# Patient Record
Sex: Female | Born: 1950 | Hispanic: Yes | Smoking: Never smoker
Health system: Southern US, Community
[De-identification: ages and names within clinical notes are randomized; demographics above are authoritative.]

## PROBLEM LIST (undated history)

## (undated) DIAGNOSIS — I10 Essential (primary) hypertension: Secondary | ICD-10-CM

## (undated) DIAGNOSIS — E119 Type 2 diabetes mellitus without complications: Secondary | ICD-10-CM

## (undated) DIAGNOSIS — M1990 Unspecified osteoarthritis, unspecified site: Secondary | ICD-10-CM

## (undated) DIAGNOSIS — E039 Hypothyroidism, unspecified: Secondary | ICD-10-CM

## (undated) HISTORY — DX: Unspecified osteoarthritis, unspecified site: M19.90

## (undated) HISTORY — DX: Essential (primary) hypertension: I10

## (undated) HISTORY — DX: Hypothyroidism, unspecified: E03.9

## (undated) HISTORY — DX: Type 2 diabetes mellitus without complications: E11.9

---

## 2021-01-10 ENCOUNTER — Other Ambulatory Visit: Payer: Self-pay | Admitting: Family Medicine

## 2021-01-10 DIAGNOSIS — M25551 Pain in right hip: Secondary | ICD-10-CM

## 2021-01-10 DIAGNOSIS — Z9181 History of falling: Secondary | ICD-10-CM

## 2021-01-16 ENCOUNTER — Ambulatory Visit (INDEPENDENT_AMBULATORY_CARE_PROVIDER_SITE_OTHER): Payer: 59

## 2021-01-16 ENCOUNTER — Other Ambulatory Visit: Payer: Self-pay

## 2021-01-16 DIAGNOSIS — M25551 Pain in right hip: Secondary | ICD-10-CM | POA: Diagnosis not present

## 2021-01-16 DIAGNOSIS — Z9181 History of falling: Secondary | ICD-10-CM

## 2021-03-30 ENCOUNTER — Other Ambulatory Visit: Payer: Self-pay | Admitting: Family Medicine

## 2021-03-30 DIAGNOSIS — Z1231 Encounter for screening mammogram for malignant neoplasm of breast: Secondary | ICD-10-CM

## 2021-04-18 ENCOUNTER — Ambulatory Visit: Payer: 59 | Admitting: Orthopaedic Surgery

## 2021-04-18 ENCOUNTER — Ambulatory Visit (INDEPENDENT_AMBULATORY_CARE_PROVIDER_SITE_OTHER): Payer: 59

## 2021-04-18 ENCOUNTER — Encounter: Payer: Self-pay | Admitting: Orthopaedic Surgery

## 2021-04-18 VITALS — BP 110/71 | HR 87 | Ht 61.06 in | Wt 213.2 lb

## 2021-04-18 DIAGNOSIS — M545 Low back pain, unspecified: Secondary | ICD-10-CM

## 2021-04-18 DIAGNOSIS — M47816 Spondylosis without myelopathy or radiculopathy, lumbar region: Secondary | ICD-10-CM | POA: Diagnosis not present

## 2021-04-18 DIAGNOSIS — G8929 Other chronic pain: Secondary | ICD-10-CM | POA: Diagnosis not present

## 2021-04-18 NOTE — Progress Notes (Signed)
Office Visit Note   Patient: Nina Patterson           Date of Birth: 05-21-1951           MRN: [id number] Visit Date: 04/18/2021              Requested by: No referring provider defined for this encounter. PCP: No primary care provider on file.   Assessment & Plan: Visit Diagnoses:  1. Chronic right-sided low back pain, unspecified whether sciatica present     Plan: we will proceed with physical therapy for treatment of her low back pain.  She can continue anti-inflammatory and use of her TENS unit.  Follow-up after 2 months for recheck.  Follow-Up Instructions: No follow-ups on file.   Orders:  Orders Placed This Encounter  Procedures   XR Lumbar Spine 2-3 Views   No orders of the defined types were placed in this encounter.     Procedures: No procedures performed   Clinical Data: No additional findings.   Subjective: Chief Complaint  Patient presents with   Lower Back - Pain    HPI 70-year-old female here with an interpreter with history of fall in February after she fell she had increased low back pain primarily on the right side.  She had x-rays at her PCP and was prescribed Naprosyn without relief.  States she does have a history of arthritis.  She has had pain in the middle of her spine.  CT scan was done in [place].  She is use a TENS unit which gave her some relief but pain comes back after it is off.  She has increased pain with lifting.  She does have diabetes hypertension and hypothyroidism.  She has noticed increased symptoms when she tries to exercise.  She is used Tylenol arthritis currently.  No associated bowel or bladder symptoms.  Review of Systems positive for hypertension thyroid condition high cholesterol oral medications for diabetes.   Objective: Vital Signs: BP 110/71   Pulse 87   Ht 5' 1.06" (1.551 m)   Wt 213 lb 3 oz (96.7 kg)   BMI 40.20 kg/m   Physical Exam Constitutional:      Appearance: She is well-developed.   HENT:     Head: Normocephalic.     Right Ear: External ear normal.     Left Ear: External ear normal. There is no impacted cerumen.  Eyes:     Pupils: Pupils are equal, round, and reactive to light.  Neck:     Thyroid: No thyromegaly.     Trachea: No tracheal deviation.  Cardiovascular:     Rate and Rhythm: Normal rate.  Pulmonary:     Effort: Pulmonary effort is normal.  Abdominal:     Palpations: Abdomen is soft.  Musculoskeletal:     Cervical back: No rigidity.  Skin:    General: Skin is warm and dry.  Neurological:     Mental Status: She is alert and oriented to person, place, and time.  Psychiatric:        Behavior: Behavior normal.    Ortho Exam patient has intact reflexes.  Some tenderness over the right mid lower lumbar region.  Negative logroll the hips.  Reflexes are 2+ and symmetrical.  Distal pulses are intact knees reach full extension good quad strength.  Specialty Comments:  No specialty comments available.  Imaging: No results found.   PMFS History: There are no problems to display for this patient.  Past Medical History:  Diagnosis Date   Diabetes mellitus without complication (HCC)    Hypertension    Hypothyroidism    Osteoarthritis     No family history on file.   Social History   Occupational History   Not on file  Tobacco Use   Smoking status: Never   Smokeless tobacco: Never  Substance and Sexual Activity   Alcohol use: Not Currently   Drug use: Not on file   Sexual activity: Not on file

## 2021-04-27 ENCOUNTER — Ambulatory Visit: Payer: 59

## 2021-05-02 ENCOUNTER — Encounter: Payer: Self-pay | Admitting: Physical Therapy

## 2021-05-02 ENCOUNTER — Ambulatory Visit: Payer: 59 | Attending: Orthopaedic Surgery | Admitting: Physical Therapy

## 2021-05-02 ENCOUNTER — Other Ambulatory Visit: Payer: Self-pay

## 2021-05-02 DIAGNOSIS — R293 Abnormal posture: Secondary | ICD-10-CM | POA: Insufficient documentation

## 2021-05-02 DIAGNOSIS — M545 Low back pain, unspecified: Secondary | ICD-10-CM | POA: Insufficient documentation

## 2021-05-02 DIAGNOSIS — M6281 Muscle weakness (generalized): Secondary | ICD-10-CM | POA: Insufficient documentation

## 2021-05-02 DIAGNOSIS — G8929 Other chronic pain: Secondary | ICD-10-CM | POA: Insufficient documentation

## 2021-05-02 NOTE — Patient Instructions (Signed)
Access Code: [email or web address] URL: [email or web address] Date: 05/02/2021 Prepared by: Alphonzo Severance  Exercises Supine Posterior Pelvic Tilt - 2 x daily - 7 x weekly - 2 sets - 10 reps - 5'' hold Supine Lower Trunk Rotation - 1 x daily - 7 x weekly - 1 sets - 20 reps - 3 hold

## 2021-05-02 NOTE — Therapy (Signed)
[facility] [address] [place], [place], [address] Phone: [phone]   Fax:  [phone]  Physical Therapy Evaluation Patient Details  Name: Nina Patterson MRN: [id number] Date of Birth: [birth year] Referring Provider (PT): Eldred Manges, MD  Encounter Date: 05/02/2021   PT End of Session - 05/02/21 1447     Visit Number 1    Date for PT Re-Evaluation 06/27/21    Authorization Type Friday health plan - Aut required after visit 20    Authorization - Visit Number --   same as visit number   Authorization - Number of Visits 30    PT Start Time 1000    PT Stop Time 1045    PT Time Calculation (min) 45 min    Activity Tolerance Patient tolerated treatment well    Behavior During Therapy [facility] for tasks assessed/performed             Past Medical History:  Diagnosis Date   Diabetes mellitus without complication (HCC)    Hypertension    Hypothyroidism    Osteoarthritis     History reviewed. No pertinent surgical history.  There were no vitals filed for this visit.    Subjective Assessment - 05/02/21 1445     Subjective Nina Patterson is a 70 y.o. female who presents to clinic with chief complaint of chronic R hip and back pain (since 2013) which was exacerbated by fall in February.  MOI/History of condition:  She had in MRI in [place] in 2013 which revealed some degenerative changes in the lumbar spine.  FROM REFERRING PROVIDER: "HPI 62-year-old female here with an interpreter with history of fall in February after she fell she had increased low back pain primarily on the right side.  She had x-rays at her PCP and was prescribed Naprosyn without relief.  States she does have a history of arthritis.  She has had pain in the middle of her spine.  CT scan was done in [place].  She is use a TENS unit which gave her some relief but pain comes back after it is off.  She has increased pain with lifting.   She does have diabetes hypertension and hypothyroidism.  She has noticed increased symptoms when she tries to exercise.  She is used Tylenol arthritis currently.  No associated bowel or bladder symptoms."  This history is confirmed by patient.  Negative x-ray for compression fracture. Mechanical fall caused by slipping on carpet.  Since fall she feels unsafe without walking with a supermarket cart.  Pain location: R sided LBP from lumbar spine to illiac crest.  Red flags: denies n/t or radiating pain or bb changes.  48 hour pain intensity:  highest 10/10, current 4/10, best 4/10.  Aggs: carrying heavy object, walking, steps, hyper ext.  Eases: tylenol, tens unit, massage.  Nature: aching, sharp.  Severity: high.  Irritability: moderate.  Stage: chronic.  Stability: staying the same.  24 hour pattern: worse with activity.  Vocation/requirements: none.  Functional limitations/goals: walk in park, drive car, cook for long periods, hold grandson (26 year old).  Home environment: lives with daughter, 2 story home, her room is on first floor.  Assistive device: none.   Hand dominance: R.  Falls: see above.  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 1    Has the patient had a decrease in  activity level because of a fear of falling?  Yes    Is the patient reluctant to leave their home because of a fear of falling?  No      Observation/Other Assessments   Focus on Therapeutic Outcomes (FOTO)  next visit      Sensation   Light Touch Appears Intact      Functional Tests   Functional tests Sit to Stand      Sit to Stand   Comments 30'' STS: next visit      Posture/Postural Control   Posture Comments anterior pelvic tilt in standing      ROM / Strength   AROM / PROM / Strength AROM      AROM   AROM Assessment Site Lumbar    Lumbar Flexion 22 cm from ground    Lumbar Extension WFL P!    Lumbar - Right Side Bend WFL P!    Lumbar - Left Side Bend [facility]    Lumbar - Right Rotation WFL P!    Lumbar - Left Rotation WFL P!      Palpation   Palpation comment Spring testing (+) for pain lower lumbar spine, TTP R lumbar paraspinals, R QL, and R glute max (superior)                        Objective measurements completed on examination: See above findings.               PT Education - 05/02/21 1446     Education Details POC, diagnosis, prognosis, HEP.  Pt educated via explanation, demonstration, and handout (HEP).  Pt confirms understanding verbally.                 PT Long Term Goals - 05/02/21 1455       PT LONG TERM GOAL #1   Title Ayanna Lysa Livengood will be >75% HEP compliant throughout therapy to improve carryover between sessions and facilitate independent management of condition.    Target Date 06/27/21      PT LONG TERM GOAL #2   Title Lolita Chamia Schmutz will be able to pick up 45# from floor to simulate lifting her 19 year old grandson, not limited by pain  EVAL: limited    Target Date 06/27/21      PT LONG TERM GOAL #3   Title Sheilla Khalil Belote will be able to navigate 10 steps using reciprocal pattern, not limited by pain, to enable community ambulation  EVAL: limited by pain    Target Date  06/27/21      PT LONG TERM GOAL #4   Title FOTO goal    Target Date 06/27/21  PT LONG TERM GOAL #5   Title 30'' STS goal    Target Date 06/27/21                    Plan - 05/02/21 1450     Clinical Impression Statement Roshawn Sherilee Smotherman is a 70 y.o. female who presents to clinic with signs and sxs consistent with R sided back pain secondary to combination of articular and myofacial pain.  No radicular signs present.  Objective exam was extremely limited d/t time needed to translate and clarifying questions.  Pt presents with pain and impairments/deficits in: lumbar ROM, core strength and motor control.  Activity limitations include: lifting, driving, steps, working out.  Participation limitations include: recreation, yardwork, housework.  Pt will benefit from skilled therapy to address pain and the listed deficits in order to achieve functional goals, enable safety and independence in completion of daily tasks, and return to PLOF.  Pt is waiting to see what co-pay will be before scheduling additional appts.    Personal Factors and Comorbidities Comorbidity 3+    Comorbidities Significant PMH: diabetes, hypertension and hypothyroidism    Stability/Clinical Decision Making Stable/Uncomplicated    Clinical Decision Making Low    Rehab Potential Good    PT Frequency 2x / week    PT Duration 8 weeks    PT Treatment/Interventions --   ADLs/Self Care Home Management;Aquatic Therapy;Therapeutic activities;Therapeutic exercise;Neuromuscular re-education;Manual techniques;Iontophoresis 4mg /ml Dexamethasone;Dry needling;Gait training; Traction;Spinal Manipulations;Joint Manipulations   PT Next Visit Plan Get FOTO and create goal, complete 30'' sit to stand and create goal (or other functional strength goal), PPT progression, core strength/hip strength progression, D/L progression?    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 [place], [place], [place] Phone: [phone]   Fax:  [phone]  Name: Jaeden Westbay MRN: Vickii Chafe Date of Birth: January 05, 1951

## 2021-05-14 ENCOUNTER — Ambulatory Visit: Payer: 59 | Admitting: Internal Medicine

## 2021-05-15 ENCOUNTER — Other Ambulatory Visit: Payer: Self-pay

## 2021-05-15 ENCOUNTER — Encounter: Payer: Self-pay | Admitting: Physical Therapy

## 2021-05-15 ENCOUNTER — Ambulatory Visit: Payer: 59 | Admitting: Physical Therapy

## 2021-05-15 DIAGNOSIS — G8929 Other chronic pain: Secondary | ICD-10-CM

## 2021-05-15 DIAGNOSIS — M545 Low back pain, unspecified: Secondary | ICD-10-CM | POA: Diagnosis not present

## 2021-05-15 DIAGNOSIS — R293 Abnormal posture: Secondary | ICD-10-CM

## 2021-05-15 DIAGNOSIS — M6281 Muscle weakness (generalized): Secondary | ICD-10-CM

## 2021-05-15 NOTE — Therapy (Signed)
[facility] [address] [place], [place], [address] Phone: [phone]   Fax:  [phone]  Physical Therapy Treatment  Patient Details  Name: Nina Patterson MRN: [id number] Date of Birth: 1951/06/03 Referring Provider (PT): Eldred Manges, MD   Encounter Date: 05/15/2021   PT End of Session - 05/15/21 1622     Visit Number 2    Number of Visits 16    Date for PT Re-Evaluation 06/27/21    Authorization Type Friday health plan - Aut required after visit 20    Authorization - Visit Number --   same as visit number   Authorization - Number of Visits 30    PT Start Time 0415    PT Stop Time 0458    PT Time Calculation (min) 43 min    Activity Tolerance Patient tolerated treatment well    Behavior During Therapy [facility] for tasks assessed/performed             Past Medical History:  Diagnosis Date   Diabetes mellitus without complication (HCC)    Hypertension    Hypothyroidism    Osteoarthritis     History reviewed. No pertinent surgical history.  There were no vitals filed for this visit.   Subjective Assessment - 05/15/21 1625     Subjective Pt reports that she has been somewhat compiant with HEP.  She feels the pain is improving.  She is currently having R sided low back pain at 4/10    Patient is accompained by: Interpreter   Lawanna Kobus [phone]   Pertinent History Significant PMH: diabetes, hypertension and hypothyroidism            [facility] Adult PT Treatment/Exercise:  Therapeutic Exercise to strengthen hip and core to reduce low back and bil hip pain/R LE pain.  Pt cued for form and pacing throughout: - nu-step L5 5 min while taking subjective - Sit to stand YTB 8x - LTR - 20x - bridge with YTB as ER cue - 10x - Hip adduction with ball - 5'' x10 - alternating clamshell - YTB - 2x10  Therapeutic Activity: - administering and explaining FOTO    PT Long Term Goals - 05/15/21 1715       PT LONG  TERM GOAL #1   Title Nina Patterson will be >75% HEP compliant throughout therapy to improve carryover between sessions and facilitate independent management of condition.    Target Date 06/27/21      PT LONG TERM GOAL #2   Title Nina Patterson will be able to pick up 45# from floor to simulate lifting her 42 year old grandson, not limited by pain  EVAL: limited    Target Date 06/27/21      PT LONG TERM GOAL #3   Title Nina Patterson will be able to navigate 10 steps using reciprocal pattern, not limited by pain, to enable community ambulation  EVAL: limited by pain    Target Date 06/27/21      PT LONG TERM GOAL #4   Title Nina Patterson will improve FOTO score from 48 (on evaluation) to 61 as a proxy for functional improvement    Target Date 06/27/21      PT LONG TERM GOAL #5   Title Nina Patterson will improve 30'' STS (MCID 2) to >/= 10x to show improved LE strength and improved transfers  EVAL: 8x    Target Date 06/27/21  Plan - 05/15/21 1656     Clinical Impression Statement Pt is doing well.  Pt fatigues very quickly with mat exercises, particularly bridge.  We will continue with core and hip strengthening as appropriate.    Personal Factors and Comorbidities Comorbidity 3+    Comorbidities Significant PMH: diabetes, hypertension and hypothyroidism    Stability/Clinical Decision Making Stable/Uncomplicated    Rehab Potential Good    PT Frequency 2x / week    PT Duration 8 weeks    PT Treatment/Interventions --   ADLs/Self Care Home Management;Aquatic Therapy;Therapeutic activities;Therapeutic exercise;Neuromuscular re-education;Manual techniques;Iontophoresis 4mg /ml Dexamethasone;Dry needling;Gait training; Traction;Spinal Manipulations;Joint Manipulations   PT Next Visit Plan Get FOTO and create goal, complete 30'' sit to stand and create goal (or other functional strength goal), PPT  progression, core strength/hip strength progression, D/L progression?    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 [place], [place], [place] Phone: [phone]   Fax:  [phone]  Name: Nina Patterson MRN: Vickii Chafe Date of Birth: [birth year]

## 2021-05-17 ENCOUNTER — Ambulatory Visit: Payer: 59 | Admitting: Physical Therapy

## 2021-05-17 ENCOUNTER — Other Ambulatory Visit: Payer: Self-pay

## 2021-05-17 ENCOUNTER — Encounter: Payer: Self-pay | Admitting: Physical Therapy

## 2021-05-17 DIAGNOSIS — M6281 Muscle weakness (generalized): Secondary | ICD-10-CM

## 2021-05-17 DIAGNOSIS — G8929 Other chronic pain: Secondary | ICD-10-CM

## 2021-05-17 DIAGNOSIS — M545 Low back pain, unspecified: Secondary | ICD-10-CM | POA: Diagnosis not present

## 2021-05-17 DIAGNOSIS — R293 Abnormal posture: Secondary | ICD-10-CM

## 2021-05-17 NOTE — Therapy (Signed)
[facility] [address] [place], [place], [address] Phone: [phone]   Fax:  [phone]  Physical Therapy Treatment  Patient Details  Name: Auriana Scalia MRN: [id number] Date of Birth: [birth year] Referring Provider (PT): Eldred Manges, MD   Encounter Date: 05/17/2021   PT End of Session - 05/17/21 1559     Visit Number 3    Number of Visits 16    Date for PT Re-Evaluation 06/27/21    Authorization Type Friday health plan - Aut required after visit 20    Authorization - Visit Number --   same as visit number   Authorization - Number of Visits 30    PT Start Time 1615    PT Stop Time 1658    PT Time Calculation (min) 43 min    Activity Tolerance Patient tolerated treatment well    Behavior During Therapy [facility] for tasks assessed/performed             Past Medical History:  Diagnosis Date   Diabetes mellitus without complication (HCC)    Hypertension    Hypothyroidism    Osteoarthritis     History reviewed. No pertinent surgical history.  There were no vitals filed for this visit.   Subjective Assessment - 05/17/21 1621     Subjective Pt reports that she has been somewhat compiant with HEP.  She feels the pain is improving.  She is currently having R sided low back pain at 4/10 Aggs: walking and exercise Eases: rest    Patient is accompained by: Interpreter   Fabienne Bruns [phone]   Pertinent History Significant PMH: diabetes, hypertension and hypothyroidism              [facility] Adult PT Treatment/Exercise:   Therapeutic Exercise to strengthen hip and core to reduce low back and bil hip pain/R LE pain.  Pt cued for form and pacing throughout: - nu-step L5 5 min while taking subjective - Sit to stand 8x - LTR - 20x - bridge with RTB as ER cue -  2x10 - Hip adduction with ball - 5'' x10 - alternating clamshell - RTB - 2x10 - HS curl on P-ball - 1x10 (limited range) - Alternating SLR from foam roller  - 4x5 ea - sit to stand - 3x7 - no UE support   PT Long Term Goals - 05/15/21 1715       PT LONG TERM GOAL #1   Title Kierre Kiya Eno will be >75% HEP compliant throughout therapy to improve carryover between sessions and facilitate independent management of condition.    Target Date 06/27/21      PT LONG TERM GOAL #2   Title Reneshia Mina Babula will be able to pick up 45# from floor to simulate lifting her 71 year old grandson, not limited by pain  EVAL: limited    Target Date 06/27/21      PT LONG TERM GOAL #3   Title Kiki Nargis Abrams will be able to navigate 10 steps using reciprocal pattern, not limited by pain, to enable community ambulation  EVAL: limited by pain    Target Date 06/27/21      PT LONG TERM GOAL #4   Title Olga Sudiksha Victor will improve FOTO score from 48 (on evaluation) to 61 as a proxy for functional improvement    Target Date 06/27/21      PT LONG TERM GOAL #5   Title Chandelle Bayard Males  will improve 30'' STS (MCID 2) to >/= 10x to show improved LE strength and improved transfers  EVAL: 8x    Target Date 06/27/21                   Plan - 05/17/21 1632     Clinical Impression Statement Lexus Isolde Skaff is doing well with therapy.  She must be cued for pacing throughout (to slow down).  She is gaining strength as expected.  We will continue with core and hip strengthening.  Her R hip is particularly weak.  Continue per POC.    Personal Factors and Comorbidities Comorbidity 3+    Comorbidities Significant PMH: diabetes, hypertension and hypothyroidism    Stability/Clinical Decision Making Stable/Uncomplicated    Rehab Potential Good    PT Frequency 2x / week    PT Duration 8 weeks    PT Treatment/Interventions --   ADLs/Self Care Home Management;Aquatic Therapy;Therapeutic activities;Therapeutic exercise;Neuromuscular re-education;Manual techniques;Iontophoresis 4mg /ml Dexamethasone;Dry  needling;Gait training; Traction;Spinal Manipulations;Joint Manipulations   PT Next Visit Plan Get FOTO and create goal, complete 30'' sit to stand and create goal (or other functional strength goal), PPT progression, core strength/hip strength progression, D/L progression?    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

## 2021-05-22 ENCOUNTER — Ambulatory Visit: Payer: 59 | Admitting: Physical Therapy

## 2021-05-22 ENCOUNTER — Encounter: Payer: Self-pay | Admitting: Physical Therapy

## 2021-05-22 ENCOUNTER — Other Ambulatory Visit: Payer: Self-pay

## 2021-05-22 DIAGNOSIS — G8929 Other chronic pain: Secondary | ICD-10-CM

## 2021-05-22 DIAGNOSIS — M545 Low back pain, unspecified: Secondary | ICD-10-CM | POA: Diagnosis not present

## 2021-05-22 DIAGNOSIS — R293 Abnormal posture: Secondary | ICD-10-CM

## 2021-05-22 DIAGNOSIS — M6281 Muscle weakness (generalized): Secondary | ICD-10-CM

## 2021-05-22 NOTE — Therapy (Signed)
[facility] [address] [place], [place], [address] Phone: [phone]   Fax:  [phone]  Physical Therapy Treatment  Patient Details  Name: Nina Patterson MRN: [id number] Date of Birth: Apr 05, 1951 Referring Provider (PT): Eldred Manges, MD   Encounter Date: 05/22/2021   PT End of Session - 05/22/21 1828     Visit Number 4    Number of Visits 16    Date for PT Re-Evaluation 06/27/21    Authorization Type Friday health plan - Aut required after visit 20    Authorization - Visit Number --   same as visit number   Authorization - Number of Visits 30    PT Start Time 1830    PT Stop Time 1912    PT Time Calculation (min) 42 min    Activity Tolerance Patient tolerated treatment well    Behavior During Therapy [facility] for tasks assessed/performed             Past Medical History:  Diagnosis Date   Diabetes mellitus without complication (HCC)    Hypertension    Hypothyroidism    Osteoarthritis     History reviewed. No pertinent surgical history.  There were no vitals filed for this visit.   Subjective Assessment - 05/22/21 1832     Subjective Pt reports that she has been compliant with her HEP.  She feels the pain is improving.  She is currently having R sided low back pain at 5/10 Aggs: walking and exercise Eases: rest    Patient is accompained by: Interpreter   Nina Patterson [phone]   Pertinent History Significant PMH: diabetes, hypertension and hypothyroidism             [facility] Adult PT Treatment/Exercise:   Therapeutic Exercise to strengthen hip and core to reduce low back and bil hip pain/R LE pain.  Pt cued for form and pacing throughout: - nu-step L5 5 min while taking subjective - LTR - 20x - PPT - x10 - bridge with Blue TB as ER cue -  x10 - 3'' hold - Hip adduction with pilates ring - 3'' 2x10 - alternating clamshell - Blue TB - 2x10 - Alternating SLR from foam roller - 2x10 ea - Supine bil  shoulder ext with abdominal contraction - x10 - blue TB  Not performed:   - sit to stand - 3x10 - no UE support - low mat table     PT Long Term Goals - 05/15/21 1715       PT LONG TERM GOAL #1   Title Nina Patterson will be >75% HEP compliant throughout therapy to improve carryover between sessions and facilitate independent management of condition.    Target Date 06/27/21      PT LONG TERM GOAL #2   Title Nina Patterson will be able to pick up 45# from floor to simulate lifting her 82 year old grandson, not limited by pain  EVAL: limited    Target Date 06/27/21      PT LONG TERM GOAL #3   Title Nina Patterson will be able to navigate 10 steps using reciprocal pattern, not limited by pain, to enable community ambulation  EVAL: limited by pain    Target Date 06/27/21      PT LONG TERM GOAL #4   Title Nina Patterson will improve FOTO score from 48 (on evaluation) to 61 as a proxy for functional improvement    Target  Date 06/27/21      PT LONG TERM GOAL #5   Title Nina Patterson will improve 30'' STS (MCID 2) to >/= 10x to show improved LE strength and improved transfers  EVAL: 8x    Target Date 06/27/21                   Plan - 05/23/21 1008     Clinical Impression Statement Pt is doing well overall.  She is progressing core and hip strength as expected.  She is fatigued after session with no increase in pain.  Continue per POC.    Personal Factors and Comorbidities Comorbidity 3+    Comorbidities Significant PMH: diabetes, hypertension and hypothyroidism    Stability/Clinical Decision Making Stable/Uncomplicated    Rehab Potential Good    PT Frequency 2x / week    PT Duration 8 weeks    PT Treatment/Interventions --   ADLs/Self Care Home Management;Aquatic Therapy;Therapeutic activities;Therapeutic exercise;Neuromuscular re-education;Manual techniques;Iontophoresis 4mg /ml Dexamethasone;Dry  needling;Gait training; Traction;Spinal Manipulations;Joint Manipulations   PT Next Visit Plan Get FOTO and create goal, complete 30'' sit to stand and create goal (or other functional strength goal), PPT progression, core strength/hip strength progression, D/L progression?    PT Home Exercise Plan [email or web address]    Consulted and Agree with Plan of Care Patient             Patient will benefit from skilled therapeutic intervention in order to improve the following deficits and impairments:  Pain, Decreased strength  Visit Diagnosis: Chronic right-sided low back pain without sciatica  Muscle weakness  Abnormal posture     Problem List Patient Active Problem List   Diagnosis Date Noted   Facet degeneration of lumbar region 04/18/2021    04/20/2021 PT, DPT 05/23/21 10:09 AM  [facility] [address] [place], [place], [place] Phone: [phone]   Fax:  [phone]  Name: Nina Patterson MRN: Vickii Chafe Date of Birth: 04-Jan-1951

## 2021-05-29 ENCOUNTER — Encounter: Payer: Self-pay | Admitting: Physical Therapy

## 2021-05-29 ENCOUNTER — Other Ambulatory Visit: Payer: Self-pay

## 2021-05-29 ENCOUNTER — Ambulatory Visit: Payer: 59 | Attending: Orthopaedic Surgery | Admitting: Physical Therapy

## 2021-05-29 DIAGNOSIS — M545 Low back pain, unspecified: Secondary | ICD-10-CM | POA: Diagnosis not present

## 2021-05-29 DIAGNOSIS — M6281 Muscle weakness (generalized): Secondary | ICD-10-CM | POA: Diagnosis present

## 2021-05-29 DIAGNOSIS — R293 Abnormal posture: Secondary | ICD-10-CM | POA: Diagnosis present

## 2021-05-29 DIAGNOSIS — G8929 Other chronic pain: Secondary | ICD-10-CM | POA: Diagnosis present

## 2021-05-29 NOTE — Patient Instructions (Signed)
Access Code: [email or web address] URL: [email or web address] Date: 05/29/2021 Prepared by: Alphonzo Severance  Exercises Supine Posterior Pelvic Tilt - 2 x daily - 7 x weekly - 2 sets - 10 reps - 5'' hold Supine Lower Trunk Rotation - 1 x daily - 7 x weekly - 1 sets - 20 reps - 3 hold Supine Bridge - 1 x daily - 7 x weekly - 2 sets - 10 reps - 5 hold

## 2021-05-29 NOTE — Therapy (Signed)
[facility] Outpatient Rehabilitation [facility] [address] [place], [place], [address] Phone: [phone]   Fax:  [phone]  Physical Therapy Treatment  Patient Details  Name: Quinlyn Tep MRN: [id number] Date of Birth: [birth year] Referring Provider (PT): Eldred Manges, MD   Encounter Date: 05/29/2021   PT End of Session - 05/29/21 1538     Visit Number 5    Number of Visits 16    Date for PT Re-Evaluation 06/27/21    Authorization Type Friday health plan - Aut required after visit 20    Authorization - Visit Number --   same as visit number   Authorization - Number of Visits 30    PT Start Time 1540    PT Stop Time 1625    PT Time Calculation (min) 45 min    Activity Tolerance Patient tolerated treatment well    Behavior During Therapy [facility] for tasks assessed/performed             Past Medical History:  Diagnosis Date   Diabetes mellitus without complication (HCC)    Hypertension    Hypothyroidism    Osteoarthritis     History reviewed. No pertinent surgical history.  There were no vitals filed for this visit.   Subjective Assessment - 05/29/21 1543     Subjective Pt reports that she has been feeling much better.  She feels the pain is improving.  She is currently having R sided low back pain at 3/10 Aggs: walking and exercise Eases: rest    Patient is accompained by: Interpreter   [place] [phone]   Pertinent History Significant PMH: diabetes, hypertension and hypothyroidism             [facility] Adult PT Treatment/Exercise:   Therapeutic Exercise to strengthen hip and core to reduce low back and bil hip pain/R LE pain.  Pt cued for form and pacing throughout: - nu-step L5 5 min while taking subjective - LTR - 20x - PPT - x10 5'' hold - bridge with Blue TB as ER cue -  3x5 - 5'' hold - Hip adduction with pilates ring - 3'' 2x10 - S/L clamshell - RTB - 2x10 ea - Alternating SLR from foam roller - 2x10 ea with alternating  touch - Supine bil shoulder ext with abdominal contraction - 2x10 - black TB - sit to stand - 2x10 - no UE support - low mat table - 5#   Not performed:      PT Long Term Goals - 05/15/21 1715       PT LONG TERM GOAL #1   Title Bethanne Bayard Males will be >75% HEP compliant throughout therapy to improve carryover between sessions and facilitate independent management of condition.    Target Date 06/27/21      PT LONG TERM GOAL #2   Title Tyeisha Deneshia Zucker will be able to pick up 45# from floor to simulate lifting her 71 year old grandson, not limited by pain  EVAL: limited    Target Date 06/27/21      PT LONG TERM GOAL #3   Title Sophiah Sarahann Horrell will be able to navigate 10 steps using reciprocal pattern, not limited by pain, to enable community ambulation  EVAL: limited by pain    Target Date 06/27/21      PT LONG TERM GOAL #4   Title Jeanenne Anjeanette Petzold will improve FOTO score from 48 (on evaluation) to 61 as a proxy for  functional improvement    Target Date 06/27/21      PT LONG TERM GOAL #5   Title Akaysha Varina Hulon will improve 30'' STS (MCID 2) to >/= 10x to show improved LE strength and improved transfers  EVAL: 8x    Target Date 06/27/21                   Plan - 05/29/21 1601     Clinical Impression Statement Pt reports no increase in baseline pain following therapy  HEP was updated and reissued to patient    Overall, Jaslynne Yoland Scherr is progressing well with therapy.  Today we concentrated on core strengthening and hip strengthening.  Pt continues to show deficits in core and hip strength but these are consistently improving.  Shows significantly higher activity tolerance today.  Pt will continue to benefit from skilled physical therapy to address remaining deficits and achieve listed goals.  Continue per POC.    Personal Factors and Comorbidities Comorbidity 3+    Comorbidities Significant PMH:  diabetes, hypertension and hypothyroidism    Stability/Clinical Decision Making Stable/Uncomplicated    Rehab Potential Good    PT Frequency 2x / week    PT Duration 8 weeks    PT Treatment/Interventions ADLs/Self Care Home Management;Iontophoresis 4mg /ml Dexamethasone;Therapeutic activities;Therapeutic exercise;Neuromuscular re-education;Manual techniques;Dry needling;Vasopneumatic Device;Joint Manipulations;Spinal Manipulations;Gait training   ADLs/Self Care Home Management;Aquatic Therapy;Therapeutic activities;Therapeutic exercise;Neuromuscular re-education;Manual techniques;Iontophoresis 4mg /ml Dexamethasone;Dry needling;Gait training; Traction;Spinal Manipulations;Joint Manipulations   PT Next Visit Plan Get FOTO and create goal, complete 30'' sit to stand and create goal (or other functional strength goal), PPT progression, core strength/hip strength progression, D/L progression?    PT Home Exercise Plan [email or web address]    Consulted and Agree with Plan of Care Patient             Patient will benefit from skilled therapeutic intervention in order to improve the following deficits and impairments:  Pain, Decreased strength  Visit Diagnosis: Chronic right-sided low back pain without sciatica  Muscle weakness  Abnormal posture     Problem List Patient Active Problem List   Diagnosis Date Noted   Facet degeneration of lumbar region 04/18/2021    PT, DPT 05/29/21 4:28 PM  [facility] [address] [place], [address], [place] Phone: [phone]   Fax:  [phone]  Name: Madlynn Lundeen MRN: [id number] Date of Birth: [birth year]

## 2021-05-31 ENCOUNTER — Other Ambulatory Visit: Payer: Self-pay

## 2021-05-31 ENCOUNTER — Ambulatory Visit: Payer: 59 | Admitting: Physical Therapy

## 2021-05-31 ENCOUNTER — Encounter: Payer: Self-pay | Admitting: Physical Therapy

## 2021-05-31 NOTE — Therapy (Signed)
[facility] Outpatient Rehabilitation [facility] [address] [place], [place], [address] Phone: [phone]   Fax:  [phone]  Patient Details  Name: Nina Patterson MRN: [id number] Date of Birth: 07/02/1951 Referring Provider:  Eldred Manges, MD  Encounter Date: 05/31/2021  Pt arrived, but had to cancel the current and all future appts for financial reasons.  She was given information for [facility].  Fredderick Phenix 05/31/2021, 2:21 PM  [facility] [address] [place], [place], [address] Phone: [phone]   Fax:  [phone]

## 2021-06-05 ENCOUNTER — Telehealth: Payer: Self-pay

## 2021-06-05 NOTE — Telephone Encounter (Signed)
   Porcha Anniemae Haberkorn DOB: [birth year] MRN: [id number]   RIDER WAIVER AND RELEASE OF LIABILITY  For purposes of improving physical access to our facilities, [place] is pleased to partner with third parties to provide [facility] [place] patients or other authorized individuals the option of convenient, on-demand ground transportation services (the Chiropractor") through use of the technology service that enables users to request on-demand ground transportation from independent third-party providers.  By opting to use and accept these [employer], I, the undersigned, hereby agree on behalf of myself, and on behalf of any minor child using the Science writer for whom I am the parent or legal guardian, as follows:  Science writer provided to me are provided by independent third-party transportation providers who are not [employer] or employees and who are unaffiliated with [employer]. [place] is neither a transportation carrier nor a common or public carrier. [facility] has no control over the quality or safety of the transportation that occurs as a result of the [employer]. [facility] cannot guarantee that any third-party transportation provider will complete any arranged transportation service. [facility] makes no representation, warranty, or guarantee regarding the reliability, timeliness, quality, safety, suitability, or availability of any of the Transport Services or that they will be error free. I fully understand that traveling by vehicle involves risks and dangers of serious bodily injury, including permanent disability, paralysis, and death. I agree, on behalf of myself and on behalf of any minor child using the Transport Services for whom I am the parent or legal guardian, that the entire risk arising out of my use of the [employer] remains solely with me, to the maximum extent permitted under applicable law. The [employer] are  provided "as is" and "as available." [facility] disclaims all representations and warranties, express, implied or statutory, not expressly set out in these terms, including the implied warranties of merchantability and fitness for a particular purpose. I hereby waive and release [facility], its agents, employees, officers, directors, representatives, insurers, attorneys, assigns, successors, subsidiaries, and affiliates from any and all past, present, or future claims, demands, liabilities, actions, causes of action, or suits of any kind directly or indirectly arising from acceptance and use of the [employer]. I further waive and release [place] and its affiliates from all present and future liability and responsibility for any injury or death to persons or damages to property caused by or related to the use of the [employer]. I have read this Waiver and Release of Liability, and I understand the terms used in it and their legal significance. This Waiver is freely and voluntarily given with the understanding that my right (as well as the right of any minor child for whom I am the parent or legal guardian using the [employer]) to legal recourse against [facility] in connection with the [employer] is knowingly surrendered in return for use of these services.   I attest that I read the consent document to Annelisa Bayard Males, gave Ms. Berdella Bacot the opportunity to ask questions and answered the questions asked (if any). I affirm that Carmela Shamyra Farias then provided consent for she's participation in this program.     Launa Grill, read in [place]

## 2021-06-07 ENCOUNTER — Encounter: Payer: Self-pay | Admitting: Physical Therapy

## 2021-06-07 ENCOUNTER — Other Ambulatory Visit: Payer: Self-pay

## 2021-06-07 ENCOUNTER — Encounter: Payer: 59 | Admitting: Physical Therapy

## 2021-06-07 ENCOUNTER — Ambulatory Visit: Payer: 59 | Admitting: Physical Therapy

## 2021-06-07 DIAGNOSIS — M6281 Muscle weakness (generalized): Secondary | ICD-10-CM

## 2021-06-07 DIAGNOSIS — R293 Abnormal posture: Secondary | ICD-10-CM

## 2021-06-07 DIAGNOSIS — M545 Low back pain, unspecified: Secondary | ICD-10-CM | POA: Diagnosis not present

## 2021-06-07 DIAGNOSIS — G8929 Other chronic pain: Secondary | ICD-10-CM

## 2021-06-07 NOTE — Therapy (Signed)
Nina Patterson, [place], [address] Phone: [phone]   Fax:  [phone]  Physical Therapy Treatment  Patient Details  Name: Nina Patterson MRN: [id number] Date of Birth: 05-Sep-1951 Referring Provider (PT): Marybelle Killings, MD   Encounter Date: 06/07/2021   PT End of Session - 06/07/21 1356     Visit Number 6    Number of Visits 16    Date for PT Re-Evaluation 06/27/21    Authorization Type Friday health plan - Aut required after visit 75    Authorization - Visit Number --   same as visit number   Authorization - Number of Visits 30    PT Start Time 0814    PT Stop Time 1437    PT Time Calculation (min) 42 min    Activity Tolerance Patient tolerated treatment well    Behavior During Therapy [facility] for tasks assessed/performed             Past Medical History:  Diagnosis Date   Diabetes mellitus without complication ([place])    Hypertension    Hypothyroidism    Osteoarthritis     History reviewed. No pertinent surgical history.  There were no vitals filed for this visit.   Subjective Assessment - 06/07/21 1401     Subjective Pt reports that she has been doing the exercises at home.  She reports that last visit she when she was pulling on the bands it mader her shoulder sore.  She is currently having R sided low back pain at 5/10 Aggs: walking and exercise Eases: rest    Patient is accompained by: Interpreter   Nina Patterson [phone]   Pertinent History Significant PMH: diabetes, hypertension and hypothyroidism            FOTO: 71  [facility] Adult PT Treatment/Exercise:   Therapeutic Exercise to strengthen hip and core to reduce low back and bil hip pain/R LE pain.  Pt cued for form and pacing throughout: - nu-step L5 5 min while taking subjective - LTR - 20x - PPT - x10 5'' hold - bridge with Blue TB as ER cue -  10x 5'' hold - Hip adduction with pilates ring - 5'' x10 - S/L clamshell - GTB -  2x10 ea - Alternating SLR from foam roller - 2x10 ea with alternating touch - sit to stand - 2x10 - no UE support - low mat table - 10#  Therapeutic Activity - collecting information for FOTO and reviewing with patient    PT Long Term Goals - 06/07/21 1405       PT LONG TERM GOAL #1   Title Nina Patterson will be >75% HEP compliant throughout therapy to improve carryover between sessions and facilitate independent management of condition.    Baseline 9/15 MET, ongoing    Status On-going      PT LONG TERM GOAL #2   Title Nina Patterson will be able to pick up 45# from floor to simulate lifting her 39 year old grandson, not limited by pain  EVAL: limited      PT LONG TERM GOAL #3   Title Nina Patterson will be able to navigate 10 steps using reciprocal pattern, not limited by pain, to enable community ambulation  EVAL: limited by pain      PT LONG TERM GOAL #4   Title Nina Patterson will improve FOTO score from 48 (on evaluation) to 61 as a  proxy for functional improvement      PT LONG TERM GOAL #5   Title Nina Patterson will improve 30'' STS (MCID 2) to >/= 10x to show improved LE strength and improved transfers  EVAL: 8x                   Plan - 06/07/21 1440     Clinical Impression Statement Pt reports mild pain reduction following therapy  HEP was reviewed, but left unchanged    Overall, Nina Patterson is progressing well with therapy.  Today we concentrated on core strengthening, lower extremity strengthening, and hip strengthening.  Pt has slight elevation in baseline pain after being away form PT for about 1 week.  Despite having slightly higher baseline pain she is able to progress multiple exercises and her FOTO score has increased.  Pt will continue to benefit from skilled physical therapy to address remaining deficits and achieve listed goals.  Continue per POC.    Personal Factors and  Comorbidities Comorbidity 3+    Comorbidities Significant PMH: diabetes, hypertension and hypothyroidism    Stability/Clinical Decision Making Stable/Uncomplicated    Rehab Potential Good    PT Frequency 2x / week    PT Duration 8 weeks    PT Treatment/Interventions ADLs/Self Care Home Management;Iontophoresis 40m/ml Dexamethasone;Therapeutic activities;Therapeutic exercise;Neuromuscular re-education;Manual techniques;Dry needling;Vasopneumatic Device;Joint Manipulations;Spinal Manipulations;Gait training   ADLs/Self Care Home Management;Aquatic Therapy;Therapeutic activities;Therapeutic exercise;Neuromuscular re-education;Manual techniques;Iontophoresis 466mml Dexamethasone;Dry needling;Gait training; Traction;Spinal Manipulations;Joint Manipulations   PT Next Visit Plan Get FOTO and create goal, complete 3071 sit to stand and create goal (or other functional strength goal), PPT progression, core strength/hip strength progression, D/L progression?    PT Home Exercise Plan DVLong [place] Agree with Plan of Care Patient             Patient will benefit from skilled therapeutic intervention in order to improve the following deficits and impairments:  Pain, Decreased strength  Visit Diagnosis: Chronic right-sided low back pain without sciatica  Muscle weakness  Abnormal posture     Problem List Patient Active Problem List   Diagnosis Date Noted   Facet degeneration of lumbar region 04/18/2021    KaMathis DadPT 06/07/2021, 2:41 PM  [facility]: [phone] Fax:  [phone]: SuGabrelle RocaRN: [id number] of Birth: [birth year]

## 2021-06-12 ENCOUNTER — Encounter: Payer: 59 | Admitting: Physical Therapy

## 2021-06-12 ENCOUNTER — Ambulatory Visit: Payer: 59 | Admitting: Physical Therapy

## 2021-06-14 ENCOUNTER — Encounter: Payer: 59 | Admitting: Physical Therapy

## 2021-06-14 ENCOUNTER — Ambulatory Visit: Payer: 59 | Admitting: Physical Therapy

## 2021-06-14 ENCOUNTER — Other Ambulatory Visit: Payer: Self-pay

## 2021-06-14 ENCOUNTER — Encounter: Payer: Self-pay | Admitting: Physical Therapy

## 2021-06-14 DIAGNOSIS — G8929 Other chronic pain: Secondary | ICD-10-CM

## 2021-06-14 DIAGNOSIS — M6281 Muscle weakness (generalized): Secondary | ICD-10-CM

## 2021-06-14 DIAGNOSIS — M545 Low back pain, unspecified: Secondary | ICD-10-CM | POA: Diagnosis not present

## 2021-06-14 DIAGNOSIS — R293 Abnormal posture: Secondary | ICD-10-CM

## 2021-06-14 NOTE — Therapy (Signed)
[place], [place], [address] Phone: [phone]   Fax:  [phone]  Physical Therapy Treatment  Patient Details  Name: Elandra Powell MRN: [id number] Date of Birth: 03-19-1951 Referring Provider (PT): Marybelle Killings, MD   Encounter Date: 06/14/2021   PT End of Session - 06/14/21 1351     Visit Number 7    Number of Visits 16    Date for PT Re-Evaluation 06/27/21    Authorization Type [employer] plan - Aut required after visit 48    Authorization - Visit Number --   same as visit number   Authorization - Number of Visits 30    PT Start Time 1350    PT Stop Time 1430    PT Time Calculation (min) 40 min    Activity Tolerance Patient tolerated treatment well    Behavior During Therapy [facility] for tasks assessed/performed             Past Medical History:  Diagnosis Date   Diabetes mellitus without complication ([place])    Hypertension    Hypothyroidism    Osteoarthritis     History reviewed. No pertinent surgical history.  There were no vitals filed for this visit.   Subjective Assessment - 06/14/21 1357     Subjective Pt reports that she has been having some L shoulder pain which she feels like may be arthritis.  Her back is feeling better today.  She is currently having R sided low back pain at 2/10 Aggs: walking and exercise Eases: rest    Patient is accompained by: Interpreter   Filomena Jungling [phone]   Pertinent History Significant PMH: diabetes, hypertension and hypothyroidism              [facility] Adult PT Treatment/Exercise:   Therapeutic Exercise to strengthen hip and core to reduce low back and bil hip pain/R LE pain.  Pt cued for form and pacing throughout: - nu-step L6 5 min while taking subjective (no UE) - LTR - 20x - PPT - x10 5'' hold - bridge with Blue TB as ER cue -  2x5 10'' hold - Hip adduction with pilates ring - 5'' x10 - S/L clamshell - GTB - 3x10 ea -  Alternating SLR from foam roller - 2x10 ea - 1# - sit to stand - 2x10 - no UE support - low mat table - 15#    PT Long Term Goals - 06/07/21 1405       PT LONG TERM GOAL #1   Title Norine Eston Esters will be >75% HEP compliant throughout therapy to improve carryover between sessions and facilitate independent management of condition.    Baseline 9/15 MET, ongoing    Status On-going      PT LONG TERM GOAL #2   Title Oliviarose Bernis Stecher will be able to pick up 45# from floor to simulate lifting her 37 year old grandson, not limited by pain  EVAL: limited      PT LONG TERM GOAL #3   Title Izzy Cheril Slattery will be able to navigate 10 steps using reciprocal pattern, not limited by pain, to enable community ambulation  EVAL: limited by pain      PT LONG TERM GOAL #4   Title Colwell will improve FOTO score from 48 (on evaluation) to 61 as a proxy for functional improvement      PT LONG TERM GOAL #5   Title  Arleth Eston Esters will improve 30'' STS (MCID 2) to >/= 10x to show improved LE strength and improved transfers  EVAL: 8x                   Plan - 06/14/21 1420     Clinical Impression Statement Pt reports no increase in baseline pain following therapy  HEP was reviewed, but left unchanged    Overall, Jersi Lyndy Russman is progressing well with therapy.  Today we concentrated on core strengthening, hip strengthening, and functional strengthening.  Pt continues to progress core and hip strength with good improvement in both baseline pain and function.  We will continue to progress as tolerated.  Pt will continue to benefit from skilled physical therapy to address remaining deficits and achieve listed goals.  Continue per POC.    Personal Factors and Comorbidities Comorbidity 3+    Comorbidities Significant PMH: diabetes, hypertension and hypothyroidism    Stability/Clinical Decision Making Stable/Uncomplicated     Rehab Potential Good    PT Frequency 2x / week    PT Duration 8 weeks    PT Treatment/Interventions ADLs/Self Care Home Management;Iontophoresis 14m/ml Dexamethasone;Therapeutic activities;Therapeutic exercise;Neuromuscular re-education;Manual techniques;Dry needling;Vasopneumatic Device;Joint Manipulations;Spinal Manipulations;Gait training   ADLs/Self Care Home Management;Aquatic Therapy;Therapeutic activities;Therapeutic exercise;Neuromuscular re-education;Manual techniques;Iontophoresis 466mml Dexamethasone;Dry needling;Gait training; Traction;Spinal Manipulations;Joint Manipulations   PT Next Visit Plan Get FOTO and create goal, complete 3060 sit to stand and create goal (or other functional strength goal), PPT progression, core strength/hip strength progression, D/L progression?    PT Home Exercise Plan DVLight [place]nd Agree with Plan of Care Patient             Patient will benefit from skilled therapeutic intervention in order to improve the following deficits and impairments:  Pain, Decreased strength  Visit Diagnosis: Chronic right-sided low back pain without sciatica  Muscle weakness  Abnormal posture     Problem List Patient Active Problem List   Diagnosis Date Noted   Facet degeneration of lumbar region 04/18/2021    KaMathis DadPT 06/14/2021, 2:36 PM  [facility]rOptimaNCAl[place]hone: [phone] Fax:  [phone]: SuBerdina CheeverRN: [id number] of Birth: [birth year]

## 2021-06-19 ENCOUNTER — Other Ambulatory Visit: Payer: Self-pay

## 2021-06-19 ENCOUNTER — Encounter: Payer: Self-pay | Admitting: Orthopaedic Surgery

## 2021-06-19 ENCOUNTER — Encounter: Payer: Self-pay | Admitting: *Deleted

## 2021-06-19 ENCOUNTER — Ambulatory Visit (INDEPENDENT_AMBULATORY_CARE_PROVIDER_SITE_OTHER): Payer: 59 | Admitting: Orthopaedic Surgery

## 2021-06-19 ENCOUNTER — Ambulatory Visit: Payer: Self-pay

## 2021-06-19 VITALS — BP 117/75 | Ht 61.06 in | Wt 213.0 lb

## 2021-06-19 DIAGNOSIS — M542 Cervicalgia: Secondary | ICD-10-CM

## 2021-06-19 DIAGNOSIS — M47816 Spondylosis without myelopathy or radiculopathy, lumbar region: Secondary | ICD-10-CM | POA: Diagnosis not present

## 2021-06-19 NOTE — Progress Notes (Signed)
Office Visit Note   Patient: Nina Patterson           Date of Birth: May 05, 1951           MRN: [id number] Visit Date: 06/19/2021              Requested by: No referring provider defined for this encounter. PCP: Pcp, No   Assessment & Plan: Visit Diagnoses:  1. Neck pain   2. Facet degeneration of lumbar region     Plan: We will add some cervical treatment to her physical therapy continue lumbar therapy.  She can follow-up in 6 weeks.  X-ray findings were reviewed with patient.  Follow-Up Instructions: Return in about 6 weeks (around 07/31/2021).   Orders:  Orders Placed This Encounter  Procedures   XR Cervical Spine 2 or 3 views   Ambulatory referral to Physical Therapy   No orders of the defined types were placed in this encounter.     Procedures: No procedures performed   Clinical Data: No additional findings.   Subjective: Chief Complaint  Patient presents with   Lower Back - Follow-up    HPI 70 year old female returns with ongoing problems with low back pain and neck pain.  Patient has degenerative facet changes in the lumbar spine.  She was diagnosed with osteoarthritis in [place].  She is used Tylenol arthritis also glucosamine liquid.  She had previous MRI in [place].  1 episode of falling in February.  She states therapy was helping her back a lot she has had 5 sessions.  They have not worked on her cervical spine.  Review of Systems 14 point system update unchanged from 04/18/2021 office visit.   Objective: Vital Signs: BP 117/75   Ht 5' 1.06" (1.551 m)   Wt 213 lb (96.6 kg)   BMI 40.17 kg/m   Physical Exam Constitutional:      Appearance: She is well-developed.  HENT:     Head: Normocephalic.     Right Ear: External ear normal.     Left Ear: External ear normal. There is no impacted cerumen.  Eyes:     Pupils: Pupils are equal, round, and reactive to light.  Neck:     Thyroid: No thyromegaly.     Trachea: No tracheal  deviation.  Cardiovascular:     Rate and Rhythm: Normal rate.  Pulmonary:     Effort: Pulmonary effort is normal.  Abdominal:     Palpations: Abdomen is soft.  Musculoskeletal:     Cervical back: No rigidity.  Skin:    General: Skin is warm and dry.  Neurological:     Mental Status: She is alert and oriented to person, place, and time.  Psychiatric:        Behavior: Behavior normal.    Ortho Exam patient has intact upper and lower extremity reflexes.  Some tenderness of the lumbar spine.  Negative Spurling right and left.  Specialty Comments:  No specialty comments available.  Imaging: No results found.   PMFS History: Patient Active Problem List   Diagnosis Date Noted   Facet degeneration of lumbar region 04/18/2021   Past Medical History:  Diagnosis Date   Diabetes mellitus without complication (HCC)    Hypertension    Hypothyroidism    Osteoarthritis     No family history on file.  No past surgical history on file. Social History   Occupational History   Not on file  Tobacco Use   Smoking status: Never  Smokeless tobacco: Never  Substance and Sexual Activity   Alcohol use: Not Currently   Drug use: Not on file   Sexual activity: Not on file

## 2021-06-26 ENCOUNTER — Other Ambulatory Visit: Payer: Self-pay

## 2021-06-26 ENCOUNTER — Ambulatory Visit: Payer: 59 | Attending: Orthopaedic Surgery | Admitting: Physical Therapy

## 2021-06-26 ENCOUNTER — Encounter (HOSPITAL_BASED_OUTPATIENT_CLINIC_OR_DEPARTMENT_OTHER): Payer: Self-pay | Admitting: Cardiology

## 2021-06-26 ENCOUNTER — Encounter: Payer: Self-pay | Admitting: Physical Therapy

## 2021-06-26 DIAGNOSIS — M25512 Pain in left shoulder: Secondary | ICD-10-CM | POA: Diagnosis present

## 2021-06-26 DIAGNOSIS — M545 Low back pain, unspecified: Secondary | ICD-10-CM | POA: Insufficient documentation

## 2021-06-26 DIAGNOSIS — R293 Abnormal posture: Secondary | ICD-10-CM | POA: Diagnosis present

## 2021-06-26 DIAGNOSIS — G8929 Other chronic pain: Secondary | ICD-10-CM | POA: Diagnosis present

## 2021-06-26 DIAGNOSIS — M6281 Muscle weakness (generalized): Secondary | ICD-10-CM | POA: Insufficient documentation

## 2021-06-26 NOTE — Therapy (Signed)
Nina Patterson, [place], [address] Phone: [phone]   Fax:  [phone]  Physical Therapy Treatment  Patient Details  Name: Nina Patterson MRN: [id number] Date of Birth: June 02, 1951 Referring Provider (PT): Marybelle Killings, MD   Encounter Date: 06/26/2021   PT End of Session - 06/26/21 1400     Visit Number 8    Number of Visits 24    Date for PT Re-Evaluation 08/21/21    Authorization Type [employer] health plan - Aut required after visit 37    Authorization - Visit Number --   same as visit number   Authorization - Number of Visits 30    PT Start Time 1400    PT Stop Time 1445    PT Time Calculation (min) 45 min    Activity Tolerance Patient tolerated treatment well    Behavior During Therapy [facility] for tasks assessed/performed             Past Medical History:  Diagnosis Date   Diabetes mellitus without complication ([place])    Hypertension    Hypothyroidism    Osteoarthritis     History reviewed. No pertinent surgical history.  There were no vitals filed for this visit.   Subjective Assessment - 06/26/21 1407     Subjective Pt reports that she went to the MD.  She feels her back is improving.  She would like to add in therapy for her back and neck, for which she recieved a referral.  She is currently having R sided low back pain at 2/10 Aggs: walking and exercise Eases: rest.  Her neck and shoulder pain is 8/10 aggs: lifting arm OH Eases: rest and heat time frame: chonic with no acute injury    Patient is accompained by: Interpreter   Ipad   Pertinent History Significant PMH: diabetes, hypertension and hypothyroidism                [facility] PT Assessment - 06/26/21 0001       Observation/Other Assessments   Focus on Therapeutic Outcomes (FOTO)  54      Functional Tests   Functional tests Other      Sit to Stand   Comments 30'': 9x      Other:   Other/ Comments Grip strength  (kg): L: 11, R: 18      ROM / Strength   AROM / PROM / Strength Strength      AROM   Overall AROM Comments Cervical ROM WNL and pain free; R shoulder WNL and pain free    AROM Assessment Site Cervical;Shoulder    Right/Left Shoulder Right;Left    Left Shoulder Flexion 120 Degrees   P!   Left Shoulder ABduction 80 Degrees   P!   Left Shoulder Internal Rotation --   L5 P!   Left Shoulder External Rotation --   C7 P!     Strength   Strength Assessment Site Shoulder    Right/Left Shoulder Left    Left Shoulder Flexion 3/5   with pain   Left Shoulder External Rotation 3/5   with pain     Special Tests   Other special tests R/C test cluster (+)             Grip strength (kg): L: 11, R: 18    [facility] Adult PT Treatment/Exercise:   Therapeutic Exercise to strengthen hip and core to reduce low back and bil hip pain/R LE pain.  Pt cued for form and pacing throughout: - nu-step L6 5 min while taking subjective (no UE)  NOT TODAY:  - LTR - 20x - PPT - x10 5'' hold - bridge with Blue TB as ER cue -  2x5 10'' hold - Hip adduction with pilates ring - 5'' x10 - S/L clamshell - GTB - 3x10 ea - Alternating SLR from foam roller - 2x10 ea - 1# - sit to stand - 2x10 - no UE support - low mat table - 15#  Therapeutic Activity - collecting objective measures for neck and shoulder pain - collecting information for goals, checking progress, and reviewing with patient (low back pain)    PT Long Term Goals - 06/26/21 1543       PT LONG TERM GOAL #1   Title Nina Patterson will be >75% HEP compliant throughout therapy to improve carryover between sessions and facilitate independent management of condition.    Baseline 9/15 MET, ongoing    Status On-going      PT LONG TERM GOAL #2   Title Nina Patterson will be able to pick up 45# from floor to simulate lifting her 42 year old grandson, not limited by pain  EVAL: limited    Baseline 10/4: unable, but improving     Status On-going    Target Date 08/21/21      PT LONG TERM GOAL #3   Title Nina Patterson will be able to navigate 10 steps using reciprocal pattern, not limited by pain, to enable community ambulation  EVAL: limited by pain    Baseline 10/4: MET    Status Achieved      PT LONG TERM GOAL #4   Title Nina Patterson will improve FOTO score from 48 (on evaluation) to 61 as a proxy for functional improvement    Baseline 10/4: 54    Status On-going    Target Date 08/21/21      PT LONG TERM GOAL #5   Title Nina Patterson will improve 30'' STS (MCID 2) to >/= 10x to show improved LE strength and improved transfers  EVAL: 8x    Baseline 10/4: 9x    Status On-going    Target Date 08/21/21      Additional Long Term Goals   Additional Long Term Goals Yes      PT LONG TERM GOAL #6   Title Nina Patterson will improve the following MMTs to >/= 4/5 to show improvement in strength:  L shoulder ER  EVAL: 3/5 with pain  target date: 08/21/21      PT LONG TERM GOAL #7   Title Nina Patterson will demonstrate >135 degrees of active ROM in flexion to allow completion of activities involving reaching [facility], not limited by pain  EVAL: 120 degrees with pain  target date: 08/21/2021      PT LONG TERM GOAL #8   Title Nina Patterson will report >/= 50% decrease in pain from evaluation  EVAL: 8/10 max shoulder pain  target date: 08/21/2021                   Plan - 06/26/21 1541     Clinical Impression Statement Nina Patterson has progressed well with therapy.  Improved impairments include: core/hip/LE strength, low back pain.  Functional improvements include: step navigation, transfers, ability to ambulate for longer periods.  Progressions needed include: continued core and hip strengthening,  moving to more functional strengthening.  Barriers to progress include: fear avoidence.  Pt also has add on  referral for neck and shoulder pain.  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  I will add in shoulder specific goals and extend POC 8 weeks.  Please see baseline and/or status section in "Goals" for specific progress on short term and long term goals established at evaluation.  I recommend continuation of PT to allow completion of remaining goals and continued functional progression.    Personal Factors and Comorbidities Comorbidity 3+    Comorbidities Significant PMH: diabetes, hypertension and hypothyroidism    Stability/Clinical Decision Making Stable/Uncomplicated    Rehab Potential Good    PT Frequency 2x / week    PT Duration 8 weeks    PT Treatment/Interventions ADLs/Self Care Home Management;Iontophoresis 26m/ml Dexamethasone;Therapeutic activities;Therapeutic exercise;Neuromuscular re-education;Manual techniques;Dry needling;Vasopneumatic Device;Joint Manipulations;Spinal Manipulations;Gait training   ADLs/Self Care Home Management;Aquatic Therapy;Therapeutic activities;Therapeutic exercise;Neuromuscular re-education;Manual techniques;Iontophoresis 458mml Dexamethasone;Dry needling;Gait training; Traction;Spinal Manipulations;Joint Manipulations   PT Next Visit Plan Get FOTO and create goal, complete 3057 sit to stand and create goal (or other functional strength goal), PPT progression, core strength/hip strength progression, D/L progression?    PT Home Exercise Plan [id number]    Consulted and Agree with Plan of Care Patient             Patient will benefit from skilled therapeutic intervention in order to improve the following deficits and impairments:  Pain, Decreased strength  Visit Diagnosis: Chronic right-sided low back pain without sciatica - Plan: PT plan of care cert/re-cert  Muscle weakness - Plan: PT plan of care cert/re-cert  Abnormal posture - Plan: PT plan of care cert/re-cert     Problem List Patient Active Problem List    Diagnosis Date Noted   Facet degeneration of lumbar region 04/18/2021    KaMathis DadPT 06/26/2021, 3:59 PM  [facility] LanerMacDonnell HeightsNC[place]hone: [phone] Fax:  [phone]: Nina NajeraRN: [id number] of Birth: [birth year]

## 2021-07-04 ENCOUNTER — Ambulatory Visit: Payer: 59 | Admitting: Physical Therapy

## 2021-07-04 ENCOUNTER — Other Ambulatory Visit: Payer: Self-pay

## 2021-07-04 ENCOUNTER — Encounter: Payer: Self-pay | Admitting: Physical Therapy

## 2021-07-04 DIAGNOSIS — G8929 Other chronic pain: Secondary | ICD-10-CM

## 2021-07-04 DIAGNOSIS — M545 Low back pain, unspecified: Secondary | ICD-10-CM

## 2021-07-04 DIAGNOSIS — M6281 Muscle weakness (generalized): Secondary | ICD-10-CM

## 2021-07-04 DIAGNOSIS — R293 Abnormal posture: Secondary | ICD-10-CM

## 2021-07-04 NOTE — Therapy (Signed)
[place], [place], [address] Phone: [phone]   Fax:  [phone]  Physical Therapy Treatment  Patient Details  Name: Nina Patterson MRN: [id number] Date of Birth: Feb 24, 1951 Referring Provider (PT): Marybelle Killings, MD   Encounter Date: 07/04/2021   PT End of Session - 07/04/21 1359     Visit Number 9    Number of Visits 24    Date for PT Re-Evaluation 08/21/21    Authorization Type [employer] health plan - Aut required after visit 79    Authorization - Visit Number --   same as visit number   Authorization - Number of Visits 30    PT Start Time 1400    PT Stop Time 1440    PT Time Calculation (min) 40 min    Activity Tolerance Patient tolerated treatment well    Behavior During Therapy [facility] for tasks assessed/performed             Past Medical History:  Diagnosis Date   Diabetes mellitus without complication ([place])    Hypertension    Hypothyroidism    Osteoarthritis     History reviewed. No pertinent surgical history.  There were no vitals filed for this visit.   Subjective Assessment - 07/04/21 1404     Subjective Pt reports that her neck and shoulder are hurting more today.  She is having considerable pain in her L UT.  She has been unable to complete her HEP.  She is currently having R sided low back pain at 2/10 Aggs: walking and exercise Eases: rest.  Her neck and shoulder pain is 8/10 aggs: lifting arm [place] Eases: rest and heat time frame: chonic with no acute injury    Patient is accompained by: Interpreter   Ipad   Pertinent History Significant PMH: diabetes, hypertension and hypothyroidism              [facility] Adult PT Treatment/Exercise:   Therapeutic Exercise to strengthen hip and core to reduce low back and bil hip pain/R LE pain.  Pt cued for form and pacing throughout: - nu-step L6 5 min while taking subjective (no UE) - scapular retraction - 20x - scapular  clock - 20x - S/L ER - 3x20 heavy cuing for proper form - mid trap setting on wall - 2x10  Manual therapy, concentrating on increasing extensibility of restricted tissue to reduce discomfort and improve mechanics in functional movement:  - TPR and STM L UT, LS, and L sided cervical paraspinals     PT Long Term Goals - 06/26/21 1543       PT LONG TERM GOAL #1   Title Nina Patterson will be >75% HEP compliant throughout therapy to improve carryover between sessions and facilitate independent management of condition.    Baseline 9/15 MET, ongoing    Status On-going      PT LONG TERM GOAL #2   Title Nina Patterson will be able to pick up 45# from floor to simulate lifting her 7 year old grandson, not limited by pain  EVAL: limited    Baseline 10/4: unable, but improving    Status On-going    Target Date 08/21/21      PT LONG TERM GOAL #3   Title Nina Patterson will be able to navigate 10 steps using reciprocal pattern, not limited by pain, to enable community ambulation  EVAL: limited by pain    Baseline 10/4: MET  Status Achieved      PT LONG TERM GOAL #4   Title Nina Patterson will improve FOTO score from 48 (on evaluation) to 61 as a proxy for functional improvement    Baseline 10/4: 54    Status On-going    Target Date 08/21/21      PT LONG TERM GOAL #5   Title Nina Patterson will improve 30'' STS (MCID 2) to >/= 10x to show improved LE strength and improved transfers  EVAL: 8x    Baseline 10/4: 9x    Status On-going    Target Date 08/21/21      Additional Long Term Goals   Additional Long Term Goals Yes      PT LONG TERM GOAL #6   Title Nina Patterson will improve the following MMTs to >/= 4/5 to show improvement in strength:  L shoulder ER  EVAL: 3/5 with pain  target date: 08/21/21      PT LONG TERM GOAL #7   Title Nina Patterson will demonstrate >135 degrees of active  ROM in flexion to allow completion of activities involving reaching [place], not limited by pain  EVAL: 120 degrees with pain  target date: 08/21/2021      PT LONG TERM GOAL #8   Title Nina Patterson will report >/= 50% decrease in pain from evaluation  EVAL: 8/10 max shoulder pain  target date: 08/21/2021                   Plan - 07/04/21 1615     Clinical Impression Statement Pt reports significant pain reduction following therapy (>/= 3 pts NPRS)  HEP was reviewed, but left unchanged    Overall, Nina Patterson is progressing well with therapy.  Today we concentrated on rotator cuff strengthening, periscapular strengthening, and pain reduction.  Pt has significant pain reduction with manual.  She plans to get a massage pillow to use on her L UT.  We will continue MT combined with R/C strengthening moving forward.  Pt will continue to benefit from skilled physical therapy to address remaining deficits and achieve listed goals.  Continue per POC.    Personal Factors and Comorbidities Comorbidity 3+    Comorbidities Significant PMH: diabetes, hypertension and hypothyroidism    Stability/Clinical Decision Making Stable/Uncomplicated    Rehab Potential Good    PT Frequency 2x / week    PT Duration 8 weeks    PT Treatment/Interventions ADLs/Self Care Home Management;Iontophoresis 60m/ml Dexamethasone;Therapeutic activities;Therapeutic exercise;Neuromuscular re-education;Manual techniques;Dry needling;Vasopneumatic Device;Joint Manipulations;Spinal Manipulations;Gait training   ADLs/Self Care Home Management;Aquatic Therapy;Therapeutic activities;Therapeutic exercise;Neuromuscular re-education;Manual techniques;Iontophoresis 431mml Dexamethasone;Dry needling;Gait training; Traction;Spinal Manipulations;Joint Manipulations   PT Next Visit Plan Get FOTO and create goal, complete 3067 sit to stand and create goal (or other functional strength goal), PPT progression,  core strength/hip strength progression, D/L progression?    PT Home Exercise Plan DVFort Garlandnd Agree with Plan of Care Patient             Patient will benefit from skilled therapeutic intervention in order to improve the following deficits and impairments:  Pain, Decreased strength  Visit Diagnosis: Chronic right-sided low back pain without sciatica  Muscle weakness  Abnormal posture     Problem List Patient Active Problem List   Diagnosis Date Noted   Facet degeneration of lumbar region 04/18/2021    KaMathis DadPT 07/04/2021, 4:16 PM  [facility] [address] [place], [place], [address] Phone: [phone]   Fax:  [phone]  Name: Nina Patterson MRN: [id number] Date of Birth: 08-05-1951

## 2021-07-10 ENCOUNTER — Ambulatory Visit: Payer: 59 | Admitting: Physical Therapy

## 2021-07-10 ENCOUNTER — Encounter: Payer: Self-pay | Admitting: Physical Therapy

## 2021-07-10 ENCOUNTER — Other Ambulatory Visit: Payer: Self-pay

## 2021-07-10 DIAGNOSIS — G8929 Other chronic pain: Secondary | ICD-10-CM

## 2021-07-10 DIAGNOSIS — M545 Low back pain, unspecified: Secondary | ICD-10-CM | POA: Diagnosis not present

## 2021-07-10 DIAGNOSIS — R293 Abnormal posture: Secondary | ICD-10-CM

## 2021-07-10 DIAGNOSIS — M6281 Muscle weakness (generalized): Secondary | ICD-10-CM

## 2021-07-10 NOTE — Therapy (Signed)
[place], [place], [address] Phone: [phone]   Fax:  [phone]  Physical Therapy Treatment  Patient Details  Name: Nina Patterson MRN: [id number] Date of Birth: 10-Mar-1951 Referring Provider (PT): Marybelle Killings, MD   Encounter Date: 07/10/2021   PT End of Session - 07/10/21 1358     Visit Number 10    Number of Visits 24    Date for PT Re-Evaluation 08/21/21    Authorization Type [employer] plan - Aut required after visit 42    Authorization - Visit Number --   same as visit number   Authorization - Number of Visits 30    PT Start Time 7096    PT Stop Time 1435    PT Time Calculation (min) 40 min    Activity Tolerance Patient tolerated treatment well    Behavior During Therapy [facility] for tasks assessed/performed             Past Medical History:  Diagnosis Date   Diabetes mellitus without complication ([place])    Hypertension    Hypothyroidism    Osteoarthritis     History reviewed. No pertinent surgical history.  There were no vitals filed for this visit.   Subjective Assessment - 07/10/21 1406     Subjective Pt reports that her shoulder is feeling much better.  She has been doing her exercises at home.  She is currently having R sided low back pain at 2/10 Aggs: walking and exercise Eases: rest.  Her neck and shoulder pain is 5/10 aggs: lifting arm OH Eases: rest and heat time frame: chonic with no acute injury    Patient is accompained by: Interpreter   Ipad   Pertinent History Significant PMH: diabetes, hypertension and hypothyroidism             [facility] Adult PT Treatment/Exercise:   Therapeutic Exercise to strengthen hip and core to reduce low back and bil hip pain/R LE pain.  Pt cued for form and pacing throughout: - nu-step L6 5 min while taking subjective (no UE) - S/L ER - 3x20 heavy cuing for proper form - 1# - mid trap setting on wall - 2x10 - corner  stretch 45'' x2 - X arm stretch 45'' x2 - Prone row - 2x10 5# - prone T - partial ROM - 2x10   Manual therapy, concentrating on increasing extensibility of restricted tissue to reduce discomfort and improve mechanics in functional movement:   - TPR and STM L UT, LS, and L sided cervical paraspinals      PT Long Term Goals - 06/26/21 1543       PT LONG TERM GOAL #1   Title Nina Patterson will be >75% HEP compliant throughout therapy to improve carryover between sessions and facilitate independent management of condition.    Baseline 9/15 MET, ongoing    Status On-going      PT LONG TERM GOAL #2   Title Nina Patterson will be able to pick up 45# from floor to simulate lifting her 71 year old grandson, not limited by pain  EVAL: limited    Baseline 10/4: unable, but improving    Status On-going    Target Date 08/21/21      PT LONG TERM GOAL #3   Title Nina Patterson will be able to navigate 10 steps using reciprocal pattern, not limited by pain, to enable community ambulation  EVAL: limited by  pain    Baseline 10/4: MET    Status Achieved      PT LONG TERM GOAL #4   Title Nina Patterson will improve FOTO score from 48 (on evaluation) to 61 as a proxy for functional improvement    Baseline 10/4: 54    Status On-going    Target Date 08/21/21      PT LONG TERM GOAL #5   Title Nina Patterson will improve 30'' STS (MCID 2) to >/= 10x to show improved LE strength and improved transfers  EVAL: 8x    Baseline 10/4: 9x    Status On-going    Target Date 08/21/21      Additional Long Term Goals   Additional Long Term Goals Yes      PT LONG TERM GOAL #6   Title Nina Patterson will improve the following MMTs to >/= 4/5 to show improvement in strength:  L shoulder ER  EVAL: 3/5 with pain  target date: 08/21/21      PT LONG TERM GOAL #7   Title Nina Patterson will demonstrate >135 degrees of  active ROM in flexion to allow completion of activities involving reaching [place], not limited by pain  EVAL: 120 degrees with pain  target date: 08/21/2021      PT LONG TERM GOAL #8   Title Nina Patterson will report >/= 50% decrease in pain from evaluation  EVAL: 8/10 max shoulder pain  target date: 08/21/2021                   Plan - 07/10/21 1528     Clinical Impression Statement Pt reports mild pain reduction following therapy  HEP was reviewed, but left unchanged    Overall, Nina Patterson is progressing well with therapy.  Today we concentrated on rotator cuff strengthening and periscapular strengthening.  Pt shows improved r/c strength today and scapular retraction - able to move to prone T with no increase in sxs.  Pt will continue to benefit from skilled physical therapy to address remaining deficits and achieve listed goals.  Continue per POC.    Personal Factors and Comorbidities Comorbidity 3+    Comorbidities Significant PMH: diabetes, hypertension and hypothyroidism    Stability/Clinical Decision Making Stable/Uncomplicated    Rehab Potential Good    PT Frequency 2x / week    PT Duration 8 weeks    PT Treatment/Interventions ADLs/Self Care Home Management;Iontophoresis 83m/ml Dexamethasone;Therapeutic activities;Therapeutic exercise;Neuromuscular re-education;Manual techniques;Dry needling;Vasopneumatic Device;Joint Manipulations;Spinal Manipulations;Gait training   ADLs/Self Care Home Management;Aquatic Therapy;Therapeutic activities;Therapeutic exercise;Neuromuscular re-education;Manual techniques;Iontophoresis 466mml Dexamethasone;Dry needling;Gait training; Traction;Spinal Manipulations;Joint Manipulations   PT Next Visit Plan Get FOTO and create goal, complete 303 sit to stand and create goal (or other functional strength goal), PPT progression, core strength/hip strength progression, D/L progression?    PT Home Exercise Plan D[place]ind Agree with Plan of Care Patient             Patient will benefit from skilled therapeutic intervention in order to improve the following deficits and impairments:  Pain, Decreased strength  Visit Diagnosis: Chronic right-sided low back pain without sciatica  Muscle weakness  Abnormal posture     Problem List Patient Active Problem List   Diagnosis Date Noted   Facet degeneration of lumbar region 04/18/2021    KaMathis DadPT 07/10/2021, 3:29 PM  [place], [place], [address] Phone: [phone]   Fax:  [phone]  Name: Nina Patterson MRN: [id number] Date of Birth: Apr 09, 1951

## 2021-07-12 ENCOUNTER — Ambulatory Visit: Payer: 59 | Admitting: Physical Therapy

## 2021-07-12 ENCOUNTER — Encounter: Payer: Self-pay | Admitting: Physical Therapy

## 2021-07-12 ENCOUNTER — Other Ambulatory Visit: Payer: Self-pay

## 2021-07-12 DIAGNOSIS — M545 Low back pain, unspecified: Secondary | ICD-10-CM

## 2021-07-12 DIAGNOSIS — R293 Abnormal posture: Secondary | ICD-10-CM

## 2021-07-12 DIAGNOSIS — G8929 Other chronic pain: Secondary | ICD-10-CM

## 2021-07-12 DIAGNOSIS — M6281 Muscle weakness (generalized): Secondary | ICD-10-CM

## 2021-07-12 NOTE — Therapy (Addendum)
[place], [place], [address] Phone: [phone]   Fax:  [phone]  Physical Therapy Treatment  Patient Details  Name: Nina Patterson MRN: [id number] Date of Birth: 04/10/1951 Referring Provider (PT): Marybelle Killings, MD   Encounter Date: 07/12/2021   PT End of Session - 07/12/21 1049     Visit Number 11    Number of Visits 24    Date for PT Re-Evaluation 08/21/21    Authorization Type [employer] plan - Aut required after visit 31    Authorization - Visit Number --   same as visit number   Authorization - Number of Visits 30    Activity Tolerance Patient tolerated treatment well    Behavior During Therapy [facility] for tasks assessed/performed            Time: 10:45 - 11:30 am  Total time: 45 min  Past Medical History:  Diagnosis Date   Diabetes mellitus without complication ([place])    Hypertension    Hypothyroidism    Osteoarthritis     History reviewed. No pertinent surgical history.  There were no vitals filed for this visit.   Subjective Assessment - 07/12/21 1053     Subjective Pt reports that her shoulder continues to improve.  She now only has pain at end range flexion and ext.  She has been doing her exercises at home.  She is currently having R sided low back pain at 2/10 Aggs: walking and exercise Eases: rest.  Her neck and shoulder pain is 4/10 aggs: lifting arm OH Eases: rest and heat time frame: chonic with no acute injury    Patient is accompained by: Interpreter   Ipad   Pertinent History Significant PMH: diabetes, hypertension and hypothyroidism              [facility] Adult PT Treatment/Exercise:   Therapeutic Exercise to strengthen hip and core to reduce low back and bil hip pain/R LE pain.  Pt cued for form and pacing throughout: - UBE 2.5'/2.5' - L1 - S/L ER - 2x20 heavy cuing for proper form - 1# - corner stretch 47'' x2 - X arm stretch 64'' x2 - Prone row  - 2x10 5# - Prone ext - 2x10 3# - prone T - 2x15   Manual therapy, concentrating on increasing extensibility of restricted tissue to reduce discomfort and improve mechanics in functional movement:   - TPR and STM L UT, LS, and L sided cervical paraspinals     PT Long Term Goals - 06/26/21 1543       PT LONG TERM GOAL #1   Title Nina Patterson will be >75% HEP compliant throughout therapy to improve carryover between sessions and facilitate independent management of condition.    Baseline 9/15 MET, ongoing    Status On-going      PT LONG TERM GOAL #2   Title Nina Patterson will be able to pick up 45# from floor to simulate lifting her 71 year old grandson, not limited by pain  EVAL: limited    Baseline 10/4: unable, but improving    Status On-going    Target Date 08/21/21      PT LONG TERM GOAL #3   Title Nina Patterson will be able to navigate 10 steps using reciprocal pattern, not limited by pain, to enable community ambulation  EVAL: limited by pain    Baseline 10/4: MET    Status Achieved  PT LONG TERM GOAL #4   Title Nina Patterson will improve FOTO score from 48 (on evaluation) to 61 as a proxy for functional improvement    Baseline 10/4: 54    Status On-going    Target Date 08/21/21      PT LONG TERM GOAL #5   Title Nina Patterson will improve 30'' STS (MCID 2) to >/= 10x to show improved LE strength and improved transfers  EVAL: 8x    Baseline 10/4: 9x    Status On-going    Target Date 08/21/21      Additional Long Term Goals   Additional Long Term Goals Yes      PT LONG TERM GOAL #6   Title Nina Patterson will improve the following MMTs to >/= 4/5 to show improvement in strength:  L shoulder ER  EVAL: 3/5 with pain  target date: 08/21/21      PT LONG TERM GOAL #7   Title Nina Patterson will demonstrate >135 degrees of active ROM in flexion to allow completion of  activities involving reaching [place], not limited by pain  EVAL: 120 degrees with pain  target date: 08/21/2021      PT LONG TERM GOAL #8   Title Nina Patterson will report >/= 50% decrease in pain from evaluation  EVAL: 8/10 max shoulder pain  target date: 08/21/2021                   Plan - 07/12/21 1134     Clinical Impression Statement Pt reports mild pain reduction following therapy  HEP was reviewed, but left unchanged    Overall, Nina Patterson is progressing well with therapy.  Today we concentrated on rotator cuff strengthening and periscapular strengthening.  Pt continues to show improvement with gradual R/C and periscapular loading. Able to move to full arc T today.  She does have some TTP over infraspinatus today.  Pt will continue to benefit from skilled physical therapy to address remaining deficits and achieve listed goals.  Continue per POC.    Personal Factors and Comorbidities Comorbidity 3+    Comorbidities Significant PMH: diabetes, hypertension and hypothyroidism    Stability/Clinical Decision Making Stable/Uncomplicated    Rehab Potential Good    PT Frequency 2x / week    PT Duration 8 weeks    PT Treatment/Interventions ADLs/Self Care Home Management;Iontophoresis 17m/ml Dexamethasone;Therapeutic activities;Therapeutic exercise;Neuromuscular re-education;Manual techniques;Dry needling;Vasopneumatic Device;Joint Manipulations;Spinal Manipulations;Gait training   ADLs/Self Care Home Management;Aquatic Therapy;Therapeutic activities;Therapeutic exercise;Neuromuscular re-education;Manual techniques;Iontophoresis 459mml Dexamethasone;Dry needling;Gait training; Traction;Spinal Manipulations;Joint Manipulations   PT Next Visit Plan Get FOTO and create goal, complete 3058 sit to stand and create goal (or other functional strength goal), PPT progression, core strength/hip strength progression, D/L progression?    PT Home Exercise Plan [place] Agree with Plan of Care Patient             Patient will benefit from skilled therapeutic intervention in order to improve the following deficits and impairments:  Pain, Decreased strength  Visit Diagnosis: Chronic right-sided low back pain without sciatica  Muscle weakness  Abnormal posture     Problem List Patient Active Problem List   Diagnosis Date Noted   Facet degeneration of lumbar region 04/18/2021    KaMathis DadPT 07/12/2021, 11:35 AM  [facility]hone: [phone] Fax:  [phone]  Name: Nina Patterson MRN: [id number] Date of Birth: 09/29/1950

## 2021-07-17 ENCOUNTER — Ambulatory Visit: Payer: 59 | Admitting: Physical Therapy

## 2021-07-17 ENCOUNTER — Other Ambulatory Visit: Payer: Self-pay

## 2021-07-17 ENCOUNTER — Encounter: Payer: Self-pay | Admitting: Physical Therapy

## 2021-07-17 DIAGNOSIS — M545 Low back pain, unspecified: Secondary | ICD-10-CM

## 2021-07-17 DIAGNOSIS — M6281 Muscle weakness (generalized): Secondary | ICD-10-CM

## 2021-07-17 DIAGNOSIS — R293 Abnormal posture: Secondary | ICD-10-CM

## 2021-07-17 DIAGNOSIS — G8929 Other chronic pain: Secondary | ICD-10-CM

## 2021-07-17 NOTE — Therapy (Signed)
[place], [place], [address] Phone: [phone]   Fax:  [phone]  Physical Therapy Treatment  Patient Details  Name: Nina Patterson MRN: [id number] Date of Birth: 29-Jan-1951 Referring Provider (PT): Marybelle Killings, MD   Encounter Date: 07/17/2021   PT End of Session - 07/17/21 1530     Visit Number 12    Number of Visits 24    Date for PT Re-Evaluation 08/21/21    Authorization Type [employer] plan - Aut required after visit 46    Authorization - Visit Number --   same as visit number   Authorization - Number of Visits 30    PT Start Time 0802    PT Stop Time 1615    PT Time Calculation (min) 44 min    Activity Tolerance Patient tolerated treatment well    Behavior During Therapy [facility] for tasks assessed/performed             Past Medical History:  Diagnosis Date   Diabetes mellitus without complication ([place])    Hypertension    Hypothyroidism    Osteoarthritis     History reviewed. No pertinent surgical history.  There were no vitals filed for this visit.   Subjective Assessment - 07/17/21 1537     Subjective Pt reports that she woke up today with increased shoulder pain; she is not sure why.  She had a good weekend regarding her shoulder pain.  She is currently having R sided low back pain at 7/10 Aggs: walking and exercise Eases: rest.  Her neck and shoulder pain is 4/10 aggs: lifting arm [place] Eases: rest and heat time frame: chonic with no acute injury    Patient is accompained by: Interpreter   Ipad   Pertinent History Significant PMH: diabetes, hypertension and hypothyroidism             [facility] Adult PT Treatment/Exercise:   Therapeutic Exercise to strengthen hip and core to reduce low back and bil hip pain/R LE pain.  Pt cued for form and pacing throughout: - UBE 2.5'/2.5' - L1 - S/L ER - 3x10 heavy cuing for proper form - 2# - farmer's carry - 6# - 2x280' -  corner stretch 84'' x2 - X arm stretch 80'' x2 - horizontal abd with RTB w/ OH flexion - 2x10 - supine  NOT TODAY - Prone row - 2x10 5# - Prone ext - 2x10 3# - prone T - 2x15   Manual therapy, concentrating on increasing extensibility of restricted tissue to reduce discomfort and improve mechanics in functional movement:   - TPR and STM L UT, LS, L infra and supraspinatus, and L sided cervical paraspinals     PT Long Term Goals - 06/26/21 1543       PT LONG TERM GOAL #1   Title Nina Patterson will be >75% HEP compliant throughout therapy to improve carryover between sessions and facilitate independent management of condition.    Baseline 9/15 MET, ongoing    Status On-going      PT LONG TERM GOAL #2   Title Nina Patterson will be able to pick up 45# from floor to simulate lifting her 30 year old grandson, not limited by pain  EVAL: limited    Baseline 10/4: unable, but improving    Status On-going    Target Date 08/21/21      PT LONG TERM GOAL #3   Title Nina Starleen Arms  Norma Patterson will be able to navigate 10 steps using reciprocal pattern, not limited by pain, to enable community ambulation  EVAL: limited by pain    Baseline 10/4: MET    Status Achieved      PT LONG TERM GOAL #4   Title Nina Patterson will improve FOTO score from 48 (on evaluation) to 61 as a proxy for functional improvement    Baseline 10/4: 54    Status On-going    Target Date 08/21/21      PT LONG TERM GOAL #5   Title Nina Patterson will improve 30'' STS (MCID 2) to >/= 10x to show improved LE strength and improved transfers  EVAL: 8x    Baseline 10/4: 9x    Status On-going    Target Date 08/21/21      Additional Long Term Goals   Additional Long Term Goals Yes      PT LONG TERM GOAL #6   Title Nina Patterson will improve the following MMTs to >/= 4/5 to show improvement in strength:  L shoulder ER  EVAL: 3/5 with pain  target  date: 08/21/21      PT LONG TERM GOAL #7   Title Nina Patterson will demonstrate >135 degrees of active ROM in flexion to allow completion of activities involving reaching [place], not limited by pain  EVAL: 120 degrees with pain  target date: 08/21/2021      PT LONG TERM GOAL #8   Title Nina Patterson will report >/= 50% decrease in pain from evaluation  EVAL: 8/10 max shoulder pain  target date: 08/21/2021                   Plan - 07/17/21 1613     Clinical Impression Statement Pt reports mild pain reduction following therapy  HEP was reviewed, but left unchanged    Overall, Nina Patterson is progressing well with therapy.  Today we concentrated on rotator cuff strengthening and cervicothoracic strengthening.  Pt with increased pain today; exercise intensity was reduced.  Higher than normal tenderness over infraspinatus today.  Pt will continue to benefit from skilled physical therapy to address remaining deficits and achieve listed goals.  Continue per POC.    Personal Factors and Comorbidities Comorbidity 3+    Comorbidities Significant PMH: diabetes, hypertension and hypothyroidism    Stability/Clinical Decision Making Stable/Uncomplicated    Rehab Potential Good    PT Frequency 2x / week    PT Duration 8 weeks    PT Treatment/Interventions ADLs/Self Care Home Management;Iontophoresis 62m/ml Dexamethasone;Therapeutic activities;Therapeutic exercise;Neuromuscular re-education;Manual techniques;Dry needling;Vasopneumatic Device;Joint Manipulations;Spinal Manipulations;Gait training   ADLs/Self Care Home Management;Aquatic Therapy;Therapeutic activities;Therapeutic exercise;Neuromuscular re-education;Manual techniques;Iontophoresis 442mml Dexamethasone;Dry needling;Gait training; Traction;Spinal Manipulations;Joint Manipulations   PT Next Visit Plan Get FOTO and create goal, complete 3094 sit to stand and create goal (or other functional  strength goal), PPT progression, core strength/hip strength progression, D/L progression?    PT Home Exercise Plan [place] Agree with Plan of Care Patient             Patient will benefit from skilled therapeutic intervention in order to improve the following deficits and impairments:  Pain, Decreased strength  Visit Diagnosis: Chronic right-sided low back pain without sciatica  Muscle weakness  Abnormal posture     Problem List Patient Active Problem List   Diagnosis Date Noted   Facet degeneration of lumbar region 04/18/2021  Mathis Dad, PT 07/17/2021, 4:15 PM  [facility] [address] [place], [place], [address] Phone: [phone]   Fax:  [phone]  Name: Nina Patterson MRN: [id number] Date of Birth: Jul 14, 1951

## 2021-07-19 ENCOUNTER — Other Ambulatory Visit: Payer: Self-pay

## 2021-07-19 ENCOUNTER — Encounter: Payer: Self-pay | Admitting: Physical Therapy

## 2021-07-19 ENCOUNTER — Ambulatory Visit: Payer: 59 | Admitting: Physical Therapy

## 2021-07-19 DIAGNOSIS — M25512 Pain in left shoulder: Secondary | ICD-10-CM

## 2021-07-19 DIAGNOSIS — G8929 Other chronic pain: Secondary | ICD-10-CM

## 2021-07-19 DIAGNOSIS — M6281 Muscle weakness (generalized): Secondary | ICD-10-CM

## 2021-07-19 DIAGNOSIS — M545 Low back pain, unspecified: Secondary | ICD-10-CM | POA: Diagnosis not present

## 2021-07-19 DIAGNOSIS — R293 Abnormal posture: Secondary | ICD-10-CM

## 2021-07-19 NOTE — Therapy (Signed)
Nina [place], [place], [address] Phone: [phone]   Fax:  [phone]  Physical Therapy Treatment  Patient Details  Name: Nina Patterson MRN: [id number] Date of Birth: 1950-12-16 Referring Provider (PT): Marybelle Killings, MD   Encounter Date: 07/19/2021   PT End of Session - 07/19/21 1351     Visit Number 13    Number of Visits 24    Date for PT Re-Evaluation 08/21/21    Authorization Type [employer] plan - Aut required after visit 30    Authorization - Visit Number --   same as visit number   Authorization - Number of Visits 30    PT Start Time 1350    PT Stop Time 1430    PT Time Calculation (min) 40 min    Activity Tolerance Patient tolerated treatment well    Behavior During Therapy [facility] for tasks assessed/performed             Past Medical History:  Diagnosis Date   Diabetes mellitus without complication ([place])    Hypertension    Hypothyroidism    Osteoarthritis     History reviewed. No pertinent surgical history.  There were no vitals filed for this visit.   Subjective Assessment - 07/19/21 1358     Subjective Pt reports that her neck and shoulder are feeling somewhat better today. Her neck and shoulder pain is 4/10 aggs: lifting arm OH Eases: rest and heat time frame: chonic with no acute injury    Patient is accompained by: Interpreter   Ipad   Pertinent History Significant PMH: diabetes, hypertension and hypothyroidism                [facility] PT Assessment - 07/19/21 0001       AROM   Left Shoulder Flexion 135 Degrees               [facility] Adult PT Treatment/Exercise:   Therapeutic Exercise to strengthen hip and core to reduce low back and bil hip pain/R LE pain.  Pt cued for form and pacing throughout: - UBE 2.5'/2.5' - L1 - corner stretch 20'' x2 - X arm stretch 23'' x2 - thoracic ext with scap retraction and with manual OP - Bil ER with scap  retraction - 3x10 with manual op for retraction - horizontal abd with RTB w/ OH flexion - 2x10 - supine (NT)   NOT TODAY - Prone row - 2x10 5# - Prone ext - 2x10 3# - prone T - 2x15   Manual therapy, concentrating on increasing extensibility of restricted tissue to reduce discomfort and improve mechanics in functional movement:   - TPR and STM L UT, LS, L infra and supraspinatus, and L sided cervical paraspinals     PT Long Term Goals - 06/26/21 1543       PT LONG TERM GOAL #1   Title Nina Patterson will be >75% HEP compliant throughout therapy to improve carryover between sessions and facilitate independent management of condition.    Baseline 9/15 MET, ongoing    Status On-going      PT LONG TERM GOAL #2   Title Nina Patterson will be able to pick up 45# from floor to simulate lifting her 15 year old grandson, not limited by pain  EVAL: limited    Baseline 10/4: unable, but improving    Status On-going    Target Date 08/21/21      PT  LONG TERM GOAL #3   Title Nina Patterson will be able to navigate 10 steps using reciprocal pattern, not limited by pain, to enable community ambulation  EVAL: limited by pain    Baseline 10/4: MET    Status Achieved      PT LONG TERM GOAL #4   Title Nina Patterson will improve FOTO score from 48 (on evaluation) to 61 as a proxy for functional improvement    Baseline 10/4: 54    Status On-going    Target Date 08/21/21      PT LONG TERM GOAL #5   Title Nina Patterson will improve 30'' STS (MCID 2) to >/= 10x to show improved LE strength and improved transfers  EVAL: 8x    Baseline 10/4: 9x    Status On-going    Target Date 08/21/21      Additional Long Term Goals   Additional Long Term Goals Yes      PT LONG TERM GOAL #6   Title Nina Patterson will improve the following MMTs to >/= 4/5 to show improvement in strength:  L shoulder ER  EVAL: 3/5 with pain   target date: 08/21/21      PT LONG TERM GOAL #7   Title Nina Patterson will demonstrate >135 degrees of active ROM in flexion to allow completion of activities involving reaching [place], not limited by pain  EVAL: 120 degrees with pain  target date: 08/21/2021      PT LONG TERM GOAL #8   Title Nina Patterson will report >/= 50% decrease in pain from evaluation  EVAL: 8/10 max shoulder pain  target date: 08/21/2021                   Plan - 07/19/21 1616     Clinical Impression Statement Pt reports mild pain reduction following therapy  HEP was reviewed, but left unchanged    Overall, Nina Patterson is progressing well with therapy.  Today we concentrated on rotator cuff strengthening, periscapular strengthening, and scapular retraction + R/C strengthening .  Pt continues to benefit from manual therapy combined with strengthening, again showing progress with OH reaching to 135 degrees.  Pt will continue to benefit from skilled physical therapy to address remaining deficits and achieve listed goals.  Continue per POC.    Personal Factors and Comorbidities Comorbidity 3+    Comorbidities Significant PMH: diabetes, hypertension and hypothyroidism    Stability/Clinical Decision Making Stable/Uncomplicated    Rehab Potential Good    PT Frequency 2x / week    PT Duration 8 weeks    PT Treatment/Interventions ADLs/Self Care Home Management;Iontophoresis 76m/ml Dexamethasone;Therapeutic activities;Therapeutic exercise;Neuromuscular re-education;Manual techniques;Dry needling;Vasopneumatic Device;Joint Manipulations;Spinal Manipulations;Gait training   ADLs/Self Care Home Management;Aquatic Therapy;Therapeutic activities;Therapeutic exercise;Neuromuscular re-education;Manual techniques;Iontophoresis 477mml Dexamethasone;Dry needling;Gait training; Traction;Spinal Manipulations;Joint Manipulations   PT Next Visit Plan Get FOTO and create goal, complete  3027 sit to stand and create goal (or other functional strength goal), PPT progression, core strength/hip strength progression, D/L progression?    PT Home Exercise Plan [id number]    Consulted and Agree with Plan of Care Patient             Patient will benefit from skilled therapeutic intervention in order to improve the following deficits and impairments:  Pain, Decreased strength  Visit Diagnosis: Chronic left shoulder pain  Chronic right-sided low back pain without sciatica  Muscle weakness  Abnormal posture  Problem List Patient Active Problem List   Diagnosis Date Noted   Facet degeneration of lumbar region 04/18/2021    Mathis Dad, PT 07/19/2021, 4:17 PM  [facility] [address] [place], [place], [address] Phone: [phone]   Fax:  [phone]  Name: Nina Patterson MRN: [id number] Date of Birth: [birth year]

## 2021-07-23 ENCOUNTER — Other Ambulatory Visit: Payer: Self-pay

## 2021-07-23 ENCOUNTER — Ambulatory Visit (INDEPENDENT_AMBULATORY_CARE_PROVIDER_SITE_OTHER): Payer: 59 | Admitting: Internal Medicine

## 2021-07-23 ENCOUNTER — Encounter: Payer: Self-pay | Admitting: Internal Medicine

## 2021-07-23 VITALS — BP 122/70 | HR 88 | Ht 61.06 in | Wt 208.0 lb

## 2021-07-23 DIAGNOSIS — E785 Hyperlipidemia, unspecified: Secondary | ICD-10-CM | POA: Diagnosis not present

## 2021-07-23 DIAGNOSIS — E039 Hypothyroidism, unspecified: Secondary | ICD-10-CM | POA: Diagnosis not present

## 2021-07-23 DIAGNOSIS — E119 Type 2 diabetes mellitus without complications: Secondary | ICD-10-CM

## 2021-07-23 DIAGNOSIS — E041 Nontoxic single thyroid nodule: Secondary | ICD-10-CM | POA: Diagnosis not present

## 2021-07-23 LAB — BASIC METABOLIC PANEL
BUN: 23 mg/dL (ref 6–23)
CO2: 30 mEq/L (ref 19–32)
Calcium: 10 mg/dL (ref 8.4–10.5)
Chloride: 103 mEq/L (ref 96–112)
Creatinine, Ser: 1.19 mg/dL (ref 0.40–1.20)
GFR: 46.16 mL/min — ABNORMAL LOW (ref >60.00)
Glucose, Bld: 124 mg/dL — ABNORMAL HIGH (ref 70–99)
Potassium: 4.4 mEq/L (ref 3.5–5.1)
Sodium: 141 mEq/L (ref 135–145)

## 2021-07-23 LAB — MICROALBUMIN / CREATININE URINE RATIO
Creatinine,U: 64.3 mg/dL
Microalb Creat Ratio: 1.1 mg/g (ref 0.0–30.0)
Microalb, Ur: <0.7 mg/dL (ref 0.0–1.9)

## 2021-07-23 LAB — LIPID PANEL
Cholesterol: 103 mg/dL (ref 0–200)
HDL: 36.7 mg/dL — ABNORMAL LOW (ref >39.00)
NonHDL: 66.65
Total CHOL/HDL Ratio: 3
Triglycerides: 271 mg/dL — ABNORMAL HIGH (ref 0.0–149.0)
VLDL: 54.2 mg/dL — ABNORMAL HIGH (ref 0.0–40.0)

## 2021-07-23 LAB — TSH: TSH: 2.36 u[IU]/mL (ref 0.35–5.50)

## 2021-07-23 LAB — LDL CHOLESTEROL, DIRECT: Direct LDL: 45 mg/dL

## 2021-07-23 NOTE — Progress Notes (Signed)
Name: Nina Patterson  MRN/ DOB: [id number], [birth year]    Age/ Sex: 70 y.o., female    PCP: Nina Been, FNP   Reason for Endocrinology Evaluation: Hypothyroid/left thyroid nodule     Date of Initial Endocrinology Evaluation: 07/23/2021     HPI: [name] is a 70 y.o. female with a past medical history of hypothyroid , T2DM and HTN. The patient presented for initial endocrinology clinic visit on 07/23/2021 for consultative assistance with her hypothyroid/left thyroid nodule.   She was diagnosed with hypothyroidism at age 58 , and has Patterson on LT-4 replacement since then.   Today she brought printed ultrasound images with left thyroid nodule TIRAD 3 , measures  0.9 x0.9 cm on thyroid ultrasound dated 2019.    No local neck symptoms  Denies prior FNA's of the thyroid nodule  Weight fluctuates  NO constipation or diarrhea    She  also has diabetes ( A1c 6.5 % in 01/2021) ,per pt she has Patterson diagnosed with prediabetes and has Patterson on Metformin without side effects.   She does not check glucose at home but  she states she eats healthy and avoids sugar sweetened beverages    HOME ENDOCRINE MEDICATIONS   Levothyroxine 75 MCG daily Metformin 500 mg 1 tab with dinner.   HISTORY:  Past Medical History:  Past Medical History:  Diagnosis Date   Diabetes mellitus without complication ([facility])    Hypertension    Hypothyroidism    Osteoarthritis    Past Surgical History: No past surgical history on file.  Social History:  reports that she has never smoked. She has never used smokeless tobacco. She reports that she does not currently use alcohol. Family History: family history is not on file.   HOME MEDICATIONS: Allergies as of 07/23/2021   No Known Allergies      Medication List        Accurate as of July 23, 2021  3:00 PM. If you have any questions, ask your nurse or doctor.          STOP taking these medications    naproxen  500 MG tablet Commonly known as: NAPROSYN Stopped by: Scarlette Shorts, MD       TAKE these medications    acetaminophen 650 MG CR tablet Commonly known as: TYLENOL Take 650 mg by mouth every 8 (eight) hours as needed for pain.   atorvastatin 20 MG tablet Commonly known as: LIPITOR Take 20 mg by mouth at bedtime.   cholecalciferol 25 MCG (1000 UNIT) tablet Commonly known as: VITAMIN D3 Take 1,000 Units by mouth daily.   COLLAGEN PO Take 3 tablets by mouth.   EQ Aspirin Adult Low Dose 81 MG EC tablet Generic drug: aspirin Take 81 mg by mouth daily.   Euthyrox 75 MCG tablet Generic drug: levothyroxine Take 75 mcg by mouth daily. What changed: Another medication with the same name was removed. Continue taking this medication, and follow the directions you see here. Changed by: Scarlette Shorts, MD   FOLIC ACID PO Take by mouth.   GOTU KOLA PO Take by mouth.   losartan-hydrochlorothiazide 50-12.5 MG tablet Commonly known as: HYZAAR Take 1 tablet by mouth daily.   metFORMIN 500 MG 24 hr tablet Commonly known as: GLUCOPHAGE-XR SMARTSIG:1 Tablet(s) By Mouth Every Evening   QC Tumeric Complex 500 MG Caps Generic drug: Turmeric Take by mouth.          REVIEW OF SYSTEMS:  A comprehensive ROS was conducted with the patient and is negative except as per HPI and below:  Review of Systems  Gastrointestinal:  Negative for diarrhea, nausea and vomiting.      OBJECTIVE:  VS: BP 122/70 (BP Location: Left Arm, Patient Position: Sitting, Cuff Size: Large)   Pulse 88   Ht 5' 1.06" (1.551 m)   Wt 208 lb (94.3 kg)   SpO2 95%   BMI 39.22 kg/m    Wt Readings from Last 3 Encounters:  07/23/21 208 lb (94.3 kg)  06/19/21 213 lb (96.6 kg)  04/18/21 213 lb 3 oz (96.7 kg)     EXAM: General: Pt appears well and is in NAD  Neck: General: Supple without adenopathy. Thyroid: Thyroid size normal.  No goiter or nodules appreciated. No thyroid bruit.  Lungs: Clear  with good BS bilat with no rales, rhonchi, or wheezes  Heart: Auscultation: RRR.  Abdomen: Normoactive bowel sounds, soft, nontender, without masses or organomegaly palpable  Extremities:  BL LE: No pretibial edema normal ROM and strength.  Mental Status: Judgment, insight: Intact Orientation: Oriented to time, place, and person Mood and affect: No depression, anxiety, or agitation     DATA REVIEWED:   Results for [name], [name] (MRN [id number]) as of 07/25/2021 09:15  Ref. Range 07/23/2021 15:18  Sodium Latest Ref Range: 135 - 145 mEq/L 141  Potassium Latest Ref Range: 3.5 - 5.1 mEq/L 4.4  Chloride Latest Ref Range: 96 - 112 mEq/L 103  CO2 Latest Ref Range: 19 - 32 mEq/L 30  Glucose Latest Ref Range: 70 - 99 mg/dL 423 (H)  BUN Latest Ref Range: 6 - 23 mg/dL 23  Creatinine Latest Ref Range: 0.40 - 1.20 mg/dL 5.36  Calcium Latest Ref Range: 8.4 - 10.5 mg/dL 14.4  GFR Latest Ref Range: >60.00 mL/min 46.16 (L)  Total CHOL/HDL Ratio Unknown 3  Cholesterol Latest Ref Range: 0 - 200 mg/dL 315  HDL Cholesterol Latest Ref Range: >39.00 mg/dL 40.08 (L)  Direct LDL Latest Units: mg/dL 67.6  MICROALB/CREAT RATIO Latest Ref Range: 0.0 - 30.0 mg/g 1.1  NonHDL Unknown 66.65  Triglycerides Latest Ref Range: 0.0 - 149.0 mg/dL 195.0 (H)  VLDL Latest Ref Range: 0.0 - 40.0 mg/dL 93.2 (H)  TSH Latest Ref Range: 0.35 - 5.50 uIU/mL 2.36  Creatinine,U Latest Units: mg/dL 67.1  Microalb, Ur Latest Ref Range: 0.0 - 1.9 mg/dL <2.4    5/80/9983 TSH 3.825 A1c 6.5%  ASSESSMENT/PLAN/RECOMMENDATIONS:   Hypothyroidism   - Pt is clinically euthyroid  -TSh is normal  - NO changes     Medication  Continue Levothyroxine 75 mcg daily   2. Left thyroid nodule :   On  Ultrasound 2019 this was 0.9 cm in diameter No local neck symptoms  Will proceed with thyroid ultrasound   3. T2DM , Optimally Controlled , without Complications :  - Pt follows a low carb diet  - No side effects to  Metformin  - MA/Cr ratio normal   Medications : Continue Metformin 500 mg daily     4. Dyslipidemia :  - LDL at goal, Tg elevated -We will emphasize the importance of low-fat and low carbohydrate diet  Medication  Continue Atorvastatin 20 mg daily    Metformin 500 mg daily    F/U in 6 months   Signed electronically by: Lyndle Herrlich, MD  [facility] Endocrinology  [facility] [address] [place] [place], [place] [address] Phone: [phone] FAX: [phone]  CC: Nina Been, FNP [address] [place] [address] Phone: [phone] Fax: [phone]   Return to Endocrinology clinic as below: Future Appointments  Date Time Provider Department Center  07/24/2021  2:00 PM Brunetta Genera [facility]  07/26/2021  2:00 PM Brunetta Genera [facility]  07/31/2021 10:00 AM Eldred Manges, MD [facility] None  08/01/2021  2:00 PM Brunetta Genera [facility]  08/06/2021 10:40 AM Jodelle Red, MD [facility]

## 2021-07-24 ENCOUNTER — Encounter: Payer: Self-pay | Admitting: Physical Therapy

## 2021-07-24 ENCOUNTER — Ambulatory Visit: Payer: 59 | Attending: Orthopaedic Surgery | Admitting: Physical Therapy

## 2021-07-24 DIAGNOSIS — G8929 Other chronic pain: Secondary | ICD-10-CM | POA: Diagnosis present

## 2021-07-24 DIAGNOSIS — R293 Abnormal posture: Secondary | ICD-10-CM | POA: Insufficient documentation

## 2021-07-24 DIAGNOSIS — M6281 Muscle weakness (generalized): Secondary | ICD-10-CM | POA: Diagnosis present

## 2021-07-24 DIAGNOSIS — M25512 Pain in left shoulder: Secondary | ICD-10-CM | POA: Diagnosis present

## 2021-07-24 DIAGNOSIS — M545 Low back pain, unspecified: Secondary | ICD-10-CM | POA: Diagnosis present

## 2021-07-24 MED ORDER — METFORMIN HCL ER 500 MG PO TB24: 500.0000 mg | ORAL_TABLET | Freq: Every day | ORAL | 2 refills | Status: DC

## 2021-07-24 MED ORDER — EUTHYROX 75 MCG PO TABS: 75.0000 ug | ORAL_TABLET | Freq: Every day | ORAL | 2 refills | Status: DC

## 2021-07-24 NOTE — Therapy (Signed)
[place], [place], [address] Phone: [phone]   Fax:  [phone]  Physical Therapy Treatment  Patient Details  Name: Nina [place] MRN: [id number] Date of Birth: 19-Jun-1951 Referring Provider (PT): Marybelle Killings, MD   Encounter Date: 07/24/2021   PT End of Session - 07/24/21 1345     Visit Number 14    Number of Visits 24    Date for PT Re-Evaluation 08/21/21    Authorization Type [employer] health plan - Aut required after visit 55    Authorization - Visit Number --   same as visit number   Authorization - Number of Visits 30    PT Start Time 1345    PT Stop Time 1430    PT Time Calculation (min) 45 min    Activity Tolerance Patient tolerated treatment well    Behavior During Therapy [facility] for tasks assessed/performed             Past Medical History:  Diagnosis Date   Diabetes mellitus without complication ([place])    Hypertension    Hypothyroidism    Osteoarthritis     History reviewed. No pertinent surgical history.  There were no vitals filed for this visit.   Subjective Assessment - 07/24/21 1354     Subjective Pt reports continued improvmeent.  Her neck and shoulder pain is 3-4/10 aggs: lifting arm OH Eases: rest and heat time frame: chonic with no acute injury    Patient is accompained by: Interpreter   Ipad   Pertinent History Significant PMH: diabetes, hypertension and hypothyroidism              [facility] Adult PT Treatment/Exercise:   Therapeutic Exercise to strengthen hip and core to reduce low back and bil hip pain/R LE pain.  Pt cued for form and pacing throughout: - nu-step L6 35mwhile taking subjective and planning session with patient - corner stretch 45'' x2 - X arm stretch 45'' x2 - Unilateral shoulder ext with forward lean - 3x10 - GTB - S/L ER in R S/L - 3x10 - 2# - LTR - 10x ea - bridge - 8'' hold x10 - isometric abdominal squeeze - 8'' 10x - S/L  hip abduction - 3x10 ea   NOT TODAY - Prone row - 2x10 5# - Prone ext - 2x10 3# - prone T - 2x15      PT Long Term Goals - 06/26/21 1543       PT LONG TERM GOAL #1   Title Nina Patterson be >75% HEP compliant throughout therapy to improve carryover between sessions and facilitate independent management of condition.    Baseline 9/15 MET, ongoing    Status On-going      PT LONG TERM GOAL #2   Title Nina Patterson be able to pick up 45# from floor to simulate lifting her 193year old grandson, not limited by pain  EVAL: limited    Baseline 10/4: unable, but improving    Status On-going    Target Date 08/21/21      PT LONG TERM GOAL #3   Title Nina Patterson be able to navigate 10 steps using reciprocal pattern, not limited by pain, to enable community ambulation  EVAL: limited by pain    Baseline 10/4: MET    Status Achieved      PT LONG TERM GOAL #4   Title Nina Patterson  improve FOTO score from 48 (on evaluation) to 61 as a proxy for functional improvement    Baseline 10/4: 54    Status On-going    Target Date 08/21/21      PT LONG TERM GOAL #5   Title Nina Patterson will improve 30'' STS (MCID 2) to >/= 10x to show improved LE strength and improved transfers  EVAL: 8x    Baseline 10/4: 9x    Status On-going    Target Date 08/21/21      Additional Long Term Goals   Additional Long Term Goals Yes      PT LONG TERM GOAL #6   Title Nina Patterson will improve the following MMTs to >/= 4/5 to show improvement in strength:  L shoulder ER  EVAL: 3/5 with pain  target date: 08/21/21      PT LONG TERM GOAL #7   Title Nina Patterson will demonstrate >135 degrees of active ROM in flexion to allow completion of activities involving reaching [place], not limited by pain  EVAL: 120 degrees with pain  target date: 08/21/2021      PT LONG TERM GOAL #8   Title Nina Patterson will report >/= 50% decrease in pain from evaluation  EVAL: 8/10 max shoulder pain  target date: 08/21/2021                   Plan - 07/24/21 1533     Clinical Impression Statement Pt reports no increase in baseline pain following therapy  HEP was reviewed, but left unchanged    Overall, Nina Patterson is progressing well with therapy.  Today we concentrated on core strengthening, rotator cuff strengthening, and periscapular strengthening.  Pt requests that we work on her hip and LBP today.  She must be encouraged to push into fatigue with bridges and abdominal fatigue, but is able to complete with good form.  Pt will continue to benefit from skilled physical therapy to address remaining deficits and achieve listed goals.  Continue per POC.    Personal Factors and Comorbidities Comorbidity 3+    Comorbidities Significant PMH: diabetes, hypertension and hypothyroidism    Stability/Clinical Decision Making Stable/Uncomplicated    Rehab Potential Good    PT Frequency 2x / week    PT Duration 8 weeks    PT Treatment/Interventions ADLs/Self Care Home Management;Iontophoresis 37m/ml Dexamethasone;Therapeutic activities;Therapeutic exercise;Neuromuscular re-education;Manual techniques;Dry needling;Vasopneumatic Device;Joint Manipulations;Spinal Manipulations;Gait training   ADLs/Self Care Home Management;Aquatic Therapy;Therapeutic activities;Therapeutic exercise;Neuromuscular re-education;Manual techniques;Iontophoresis 452mml Dexamethasone;Dry needling;Gait training; Traction;Spinal Manipulations;Joint Manipulations   PT Next Visit Plan Get FOTO and create goal, complete 3015 sit to stand and create goal (or other functional strength goal), PPT progression, core strength/hip strength progression, D/L progression?    PT Home Exercise Plan DVDenisonnd Agree with Plan of Care Patient             Patient will benefit from skilled therapeutic  intervention in order to improve the following deficits and impairments:  Pain, Decreased strength  Visit Diagnosis: Chronic left shoulder pain  Chronic right-sided low back pain without sciatica  Muscle weakness     Problem List Patient Active Problem List   Diagnosis Date Noted   Facet degeneration of lumbar region 04/18/2021    KaMathis DadPT 07/24/2021, 3:33 PM  [facility]: [phone] Fax:  [phone]:  Olene Shakeila Pfarr MRN: [id number] Date of Birth: 1950-12-14

## 2021-07-25 ENCOUNTER — Encounter: Payer: Self-pay | Admitting: Internal Medicine

## 2021-07-26 ENCOUNTER — Ambulatory Visit: Payer: 59 | Admitting: Physical Therapy

## 2021-07-26 ENCOUNTER — Other Ambulatory Visit: Payer: Self-pay

## 2021-07-26 ENCOUNTER — Encounter: Payer: Self-pay | Admitting: Physical Therapy

## 2021-07-26 DIAGNOSIS — M6281 Muscle weakness (generalized): Secondary | ICD-10-CM

## 2021-07-26 DIAGNOSIS — M545 Low back pain, unspecified: Secondary | ICD-10-CM

## 2021-07-26 DIAGNOSIS — M25512 Pain in left shoulder: Secondary | ICD-10-CM | POA: Diagnosis not present

## 2021-07-26 DIAGNOSIS — G8929 Other chronic pain: Secondary | ICD-10-CM

## 2021-07-26 DIAGNOSIS — R293 Abnormal posture: Secondary | ICD-10-CM

## 2021-07-26 NOTE — Therapy (Signed)
[place], [place], [address] Phone: [phone]   Fax:  [phone]  Physical Therapy Treatment  Patient Details  Name: Nina Patterson MRN: [id number] Date of Birth: November 26, 1950 Referring Provider (PT): Marybelle Killings, MD   Encounter Date: 07/26/2021   PT End of Session - 07/26/21 1341     Visit Number 15    Number of Visits 24    Date for PT Re-Evaluation 08/21/21    Authorization Type [employer] plan - Aut required after visit 22    Authorization - Visit Number --   same as visit number   Authorization - Number of Visits 30    PT Start Time 1340    PT Stop Time 1422    PT Time Calculation (min) 42 min    Activity Tolerance Patient tolerated treatment well    Behavior During Therapy [facility] for tasks assessed/performed             Past Medical History:  Diagnosis Date   Diabetes mellitus without complication ([place])    Hypertension    Hypothyroidism    Osteoarthritis     History reviewed. No pertinent surgical history.  There were no vitals filed for this visit.   Subjective Assessment - 07/26/21 1347     Subjective Pt reports that she is feeling stronger and that her shoulder is improving.  Her neck and shoulder pain is 3/10 aggs: lifting arm OH Eases: rest and heat time frame: chonic with no acute injury    Patient is accompained by: Interpreter   Ipad   Pertinent History Significant PMH: diabetes, hypertension and hypothyroidism             [facility] Adult PT Treatment/Exercise:   Therapeutic Exercise to strengthen hip and core to reduce low back and bil hip pain/R LE pain.  Pt cued for form and pacing throughout: - nu-step L6 19mwhile taking subjective and planning session with patient - corner stretch 45'' x2 - X arm stretch 45'' x2 - Unilateral shoulder ext with forward lean - 3x15 - Blue TB - S/L ER in R S/L - 2x10 - 3# - LTR - 10x ea - isometric abdominal squeeze -  10'' 6x - S/L hip abduction - 3x10 ea - Sit to stand - 10x 15# 3x5 25#   NOT TODAY - Prone row - 2x10 5# - Prone ext - 2x10 3# - prone T - 2x15     PT Long Term Goals - 06/26/21 1543       PT LONG TERM GOAL #1   Title Ayriel GEston Esterswill be >75% HEP compliant throughout therapy to improve carryover between sessions and facilitate independent management of condition.    Baseline 9/15 MET, ongoing    Status On-going      PT LONG TERM GOAL #2   Title Unice GCaasi Gigliawill be able to pick up 45# from floor to simulate lifting her 128year old grandson, not limited by pain  EVAL: limited    Baseline 10/4: unable, but improving    Status On-going    Target Date 08/21/21      PT LONG TERM GOAL #3   Title Kathy GKelse Plochwill be able to navigate 10 steps using reciprocal pattern, not limited by pain, to enable community ambulation  EVAL: limited by pain    Baseline 10/4: MET    Status Achieved      PT LONG  TERM GOAL #4   Title Shenna Tangelia Sanson will improve FOTO score from 48 (on evaluation) to 61 as a proxy for functional improvement    Baseline 10/4: 54    Status On-going    Target Date 08/21/21      PT LONG TERM GOAL #5   Title Taje Kortny Lirette will improve 30'' STS (MCID 2) to >/= 10x to show improved LE strength and improved transfers  EVAL: 8x    Baseline 10/4: 9x    Status On-going    Target Date 08/21/21      Additional Long Term Goals   Additional Long Term Goals Yes      PT LONG TERM GOAL #6   Title Countess Eliani Leclere will improve the following MMTs to >/= 4/5 to show improvement in strength:  L shoulder ER  EVAL: 3/5 with pain  target date: 08/21/21      PT LONG TERM GOAL #7   Title Deven Zyona Pettaway will demonstrate >135 degrees of active ROM in flexion to allow completion of activities involving reaching [place], not limited by pain  EVAL: 120 degrees with pain  target date: 08/21/2021       PT LONG TERM GOAL #8   Title Corrin Eston Esters will report >/= 50% decrease in pain from evaluation  EVAL: 8/10 max shoulder pain  target date: 08/21/2021                   Plan - 07/26/21 1358     Clinical Impression Statement Pt reports mild pain reduction following therapy.  Overall, Alizabeth Graceann Boileau is progressing well with therapy.  Today we concentrated on core strengthening, rotator cuff strengthening, and cervicothoracic strengthening.  Pt continues to progress exercise intensity with reduced baseline pain.  We will progress shoulder exercises to OH movements as able moving forward.  Pt will continue to benefit from skilled physical therapy to address remaining deficits and achieve listed goals.  Continue per POC.    Personal Factors and Comorbidities Comorbidity 3+    Comorbidities Significant PMH: diabetes, hypertension and hypothyroidism    Stability/Clinical Decision Making Stable/Uncomplicated    Rehab Potential Good    PT Frequency 2x / week    PT Duration 8 weeks    PT Treatment/Interventions ADLs/Self Care Home Management;Iontophoresis 23m/ml Dexamethasone;Therapeutic activities;Therapeutic exercise;Neuromuscular re-education;Manual techniques;Dry needling;Vasopneumatic Device;Joint Manipulations;Spinal Manipulations;Gait training   ADLs/Self Care Home Management;Aquatic Therapy;Therapeutic activities;Therapeutic exercise;Neuromuscular re-education;Manual techniques;Iontophoresis 455mml Dexamethasone;Dry needling;Gait training; Traction;Spinal Manipulations;Joint Manipulations   PT Next Visit Plan Get FOTO and create goal, complete 3044 sit to stand and create goal (or other functional strength goal), PPT progression, core strength/hip strength progression, D/L progression?    PT Home Exercise Plan DVBunkiend Agree with Plan of Care Patient             Patient will benefit from skilled therapeutic intervention in order to  improve the following deficits and impairments:  Pain, Decreased strength  Visit Diagnosis: Chronic left shoulder pain  Chronic right-sided low back pain without sciatica  Muscle weakness  Abnormal posture     Problem List Patient Active Problem List   Diagnosis Date Noted   Facet degeneration of lumbar region 04/18/2021    KaMathis DadPT 07/26/2021, 2:30 PM  [facility] LLC[address]hone: [phone] Fax:  [phone]: SuJensen CheramieRN: [id number]  of Birth: 08/10/1951

## 2021-07-31 ENCOUNTER — Encounter: Payer: Self-pay | Admitting: Orthopaedic Surgery

## 2021-07-31 ENCOUNTER — Ambulatory Visit (INDEPENDENT_AMBULATORY_CARE_PROVIDER_SITE_OTHER): Payer: 59 | Admitting: Orthopaedic Surgery

## 2021-07-31 ENCOUNTER — Other Ambulatory Visit: Payer: Self-pay

## 2021-07-31 VITALS — BP 133/77 | HR 67 | Ht 61.06 in | Wt 208.0 lb

## 2021-07-31 DIAGNOSIS — M47816 Spondylosis without myelopathy or radiculopathy, lumbar region: Secondary | ICD-10-CM

## 2021-07-31 DIAGNOSIS — M4722 Other spondylosis with radiculopathy, cervical region: Secondary | ICD-10-CM | POA: Diagnosis not present

## 2021-07-31 NOTE — Progress Notes (Signed)
Office Visit Note   Patient: Nina Patterson           Date of Birth: [birth year]           MRN: [id number] Visit Date: 07/31/2021              Requested by: No referring provider defined for this encounter. PCP: Dot Been, FNP   Assessment & Plan: Visit Diagnoses:  1. Facet degeneration of lumbar region   2. Other spondylosis with radiculopathy, cervical region     Plan: Patient is made significant improvement with therapy.  Patient can finish up therapy return if she has progressive symptoms.  Follow-Up Instructions: No follow-ups on file.   Orders:  No orders of the defined types were placed in this encounter.  No orders of the defined types were placed in this encounter.     Procedures: No procedures performed   Clinical Data: No additional findings.   Subjective: No chief complaint on file.   HPI 70 year old female returns with ongoing problems with neck and back pain.  Patient's been going to therapy and has noticed significant improvement.  Sometimes she has had some discomfort after therapy but states she is getting better stronger and has less symptoms.  She is used to me quite also arthritis Tylenol.  She states the pain is significantly improved.  She still has some pain in her left arm.  Review of Systems 14 point systems unchanged from 04/18/2021 office visit.   Objective: Vital Signs: BP 133/77   Pulse 67   Ht 5' 1.06" (1.551 m)   Wt 208 lb (94.3 kg)   BMI 39.22 kg/m   Physical Exam Constitutional:      Appearance: She is well-developed.  HENT:     Head: Normocephalic.     Right Ear: External ear normal.     Left Ear: External ear normal. There is no impacted cerumen.  Eyes:     Pupils: Pupils are equal, round, and reactive to light.  Neck:     Thyroid: No thyromegaly.     Trachea: No tracheal deviation.  Cardiovascular:     Rate and Rhythm: Normal rate.  Pulmonary:     Effort: Pulmonary effort is normal.  Abdominal:      Palpations: Abdomen is soft.  Musculoskeletal:     Cervical back: No rigidity.  Skin:    General: Skin is warm and dry.  Neurological:     Mental Status: She is alert and oriented to person, place, and time.  Psychiatric:        Behavior: Behavior normal.    Ortho Exam patient has intact reflexes improved cervical range of motion less discomfort.  Normal heel toe gait.  Negative Spurling right and left.  Specialty Comments:  No specialty comments available.  Imaging: No results found.   PMFS History: Patient Active Problem List   Diagnosis Date Noted   Other spondylosis with radiculopathy, cervical region 07/31/2021   Facet degeneration of lumbar region 04/18/2021   Past Medical History:  Diagnosis Date   Diabetes mellitus without complication ([facility])    Hypertension    Hypothyroidism    Osteoarthritis     No family history on file.  No past surgical history on file. Social History   Occupational History   Not on file  Tobacco Use   Smoking status: Never   Smokeless tobacco: Never  Substance and Sexual Activity   Alcohol use: Not Currently   Drug use: Not  on file   Sexual activity: Not on file

## 2021-08-01 ENCOUNTER — Encounter: Payer: Self-pay | Admitting: Physical Therapy

## 2021-08-01 ENCOUNTER — Other Ambulatory Visit: Payer: Self-pay

## 2021-08-01 ENCOUNTER — Ambulatory Visit: Payer: 59 | Admitting: Physical Therapy

## 2021-08-01 DIAGNOSIS — M25512 Pain in left shoulder: Secondary | ICD-10-CM | POA: Diagnosis not present

## 2021-08-01 DIAGNOSIS — G8929 Other chronic pain: Secondary | ICD-10-CM

## 2021-08-01 DIAGNOSIS — M6281 Muscle weakness (generalized): Secondary | ICD-10-CM

## 2021-08-01 DIAGNOSIS — M545 Low back pain, unspecified: Secondary | ICD-10-CM

## 2021-08-01 DIAGNOSIS — R293 Abnormal posture: Secondary | ICD-10-CM

## 2021-08-01 NOTE — Therapy (Signed)
[place], [place], [address] Phone: [phone]   Fax:  [phone]  Physical Therapy Treatment  Patient Details  Name: Nina Patterson MRN: [id number] Date of Birth: Dec 25, 1950 Referring Provider (PT): Nina Killings, MD   Encounter Date: 08/01/2021   PT End of Session - 08/01/21 1349     Visit Number 16    Number of Visits 30    Date for PT Re-Evaluation 09/22/21   extended   Authorization Type [employer] plan - Aut required after visit 2    Authorization - Visit Number --   same as visit number   Authorization - Number of Visits 30    PT Start Time 1345    PT Stop Time 1430    PT Time Calculation (min) 45 min    Activity Tolerance Patient tolerated treatment well    Behavior During Therapy Nina Patterson for tasks assessed/performed             Past Medical History:  Diagnosis Date   Diabetes mellitus without complication ([place])    Hypertension    Hypothyroidism    Osteoarthritis     History reviewed. No pertinent surgical history.  There were no vitals filed for this visit.   Subjective Assessment - 08/01/21 1401     Subjective Pt reports continued improvement in her shoulder pain.  She feels she is able to reach [facility] easier now.  Her neck and shoulder pain is 3/10 aggs: lifting arm OH Eases: rest and heat time frame: chonic with no acute injury    Patient is accompained by: Interpreter   Ipad   Pertinent History Significant PMH: diabetes, hypertension and hypothyroidism              [facility] Adult PT Treatment/Exercise:   Therapeutic Exercise to strengthen hip and core to reduce low back and bil hip pain/R LE pain.  Pt cued for form and pacing throughout: - nu-step L7 41m while taking subjective and planning session with patient - corner stretch 45'' x2 - X arm stretch 45'' x2 - Unilateral shoulder ext with forward lean - 3x15 - Blue TB - S/L ER in R S/L - 3x12 - 3# -  isometric abdominal squeeze - 10'' 6x - S/L hip abduction - 3x10 ea (not today) - Sit to stand - 10x 15# 3x5 25#   NOT TODAY - Prone row - 2x10 5# - Prone ext - 2x10 3# - prone T - 2x15    PT Long Term Goals - 08/02/21 1013       PT LONG TERM GOAL #1   Title ALL LTG TARGET DATE: 12/31    Status New      PT LONG TERM GOAL #2   Title Nina Patterson, not limited by pain  EVAL: limited    Baseline 10/4: unable, but improving    Status On-going      PT LONG TERM GOAL #3   Title Nina Patterson will be able to navigate 10 steps using reciprocal pattern, not limited by pain, to enable community ambulation  EVAL: limited by pain    Baseline 10/4: MET    Status Achieved      PT LONG TERM GOAL #4   Title Nina Patterson will improve FOTO score from 48 (on evaluation) to 61 as a proxy for functional  improvement    Baseline 10/4: 54    Status On-going      PT LONG TERM GOAL #5   Title Nina Patterson will improve 30'' STS (MCID 2) to >/= 10x to show improved LE strength and improved transfers  EVAL: 8x    Baseline 10/4: 9x    Status On-going      PT LONG TERM GOAL #6   Title Nina Patterson will improve the following MMTs to >/= 4/5 to show improvement in strength:  L shoulder ER  EVAL: 3/5 with pain      PT LONG TERM GOAL #7   Title Nina Patterson will demonstrate >135 degrees of active ROM in flexion to allow completion of activities involving reaching [place], not limited by pain  EVAL: 120 degrees with pain    Status Revised      PT LONG TERM GOAL #8   Title Nina Patterson will report >/= 50% decrease in pain from evaluation  EVAL: 8/10 max shoulder pain    Status Revised                   Plan - 08/02/21 1021     Clinical Impression Statement Overall, [name] is progressing well with therapy.  Pt  reports no increase in baseline pain following therapy.  Today we concentrated on core strengthening, rotator cuff strengthening, periscapular strengthening, and hip strengthening.  Pt continues to progress her low back and shoulder exercises as expected.  Her MD suggested she continue PT since she has made good progress.  I will extend her POC until the end of the year.  We will begin integrating some standing core exercises in the next few weeks.  Pt will continue to benefit from skilled physical therapy to address remaining deficits and achieve listed goals.  Extending POC.    Personal Factors and Comorbidities Comorbidity 3+    Comorbidities Significant PMH: diabetes, hypertension and hypothyroidism    Stability/Clinical Decision Making Stable/Uncomplicated    Rehab Potential Good    PT Frequency 2x / week    PT Duration 8 weeks    PT Treatment/Interventions ADLs/Self Care Home Management;Iontophoresis 90m/ml Dexamethasone;Therapeutic activities;Therapeutic exercise;Neuromuscular re-education;Manual techniques;Dry needling;Vasopneumatic Device;Joint Manipulations;Spinal Manipulations;Gait training   ADLs/Self Care Home Management;Aquatic Therapy;Therapeutic activities;Therapeutic exercise;Neuromuscular re-education;Manual techniques;Iontophoresis 486mml Dexamethasone;Dry needling;Gait training; Traction;Spinal Manipulations;Joint Manipulations   PT Next Visit Plan Get FOTO and create goal, complete 3027 sit to stand and create goal (or other functional strength goal), PPT progression, core strength/hip strength progression, D/L progression?    PT Home Exercise Plan [place]nd Agree with Plan of Care Patient             Patient will benefit from skilled therapeutic intervention in order to improve the following deficits and impairments:  Pain, Decreased strength  Visit Diagnosis: Chronic left shoulder pain  Chronic right-sided low back pain without sciatica  Muscle  weakness  Abnormal posture     Problem List Patient Active Problem List   Diagnosis Date Noted   Other spondylosis with radiculopathy, cervical region 07/31/2021   Facet degeneration of lumbar region 04/18/2021    KaMathis DadPT 08/02/2021, 10:21 AM  [facility] [place]hone: [phone] Fax:  [phone]: SuBrayleigh RybackiRN: [id number] of Birth: [birth year]

## 2021-08-06 ENCOUNTER — Ambulatory Visit (HOSPITAL_BASED_OUTPATIENT_CLINIC_OR_DEPARTMENT_OTHER): Payer: 59 | Admitting: Cardiology

## 2021-08-07 ENCOUNTER — Ambulatory Visit (HOSPITAL_BASED_OUTPATIENT_CLINIC_OR_DEPARTMENT_OTHER): Payer: 59 | Admitting: Cardiology

## 2021-08-07 ENCOUNTER — Encounter (HOSPITAL_BASED_OUTPATIENT_CLINIC_OR_DEPARTMENT_OTHER): Payer: Self-pay | Admitting: Cardiology

## 2021-08-07 VITALS — BP 128/76 | HR 69 | Ht 60.0 in | Wt 202.6 lb

## 2021-08-07 DIAGNOSIS — I10 Essential (primary) hypertension: Secondary | ICD-10-CM | POA: Diagnosis not present

## 2021-08-07 DIAGNOSIS — E66812 Obesity, class 2: Secondary | ICD-10-CM

## 2021-08-07 DIAGNOSIS — Z6839 Body mass index (BMI) 39.0-39.9, adult: Secondary | ICD-10-CM

## 2021-08-07 DIAGNOSIS — E8881 Metabolic syndrome: Secondary | ICD-10-CM | POA: Diagnosis not present

## 2021-08-07 DIAGNOSIS — E119 Type 2 diabetes mellitus without complications: Secondary | ICD-10-CM

## 2021-08-07 DIAGNOSIS — R7303 Prediabetes: Secondary | ICD-10-CM | POA: Insufficient documentation

## 2021-08-07 DIAGNOSIS — Z7189 Other specified counseling: Secondary | ICD-10-CM

## 2021-08-07 HISTORY — DX: Morbid (severe) obesity due to excess calories: E66.01

## 2021-08-07 HISTORY — DX: Essential (primary) hypertension: I10

## 2021-08-07 HISTORY — DX: Obesity, class 2: E66.812

## 2021-08-07 NOTE — Progress Notes (Signed)
Cardiology Office Note:    Date:  08/07/2021   ID:  Nina Patterson, Triska 27-Nov-1950, MRN [id number]  PCP:  Nina Man, FNP  Cardiologist:  Nina Dresser, MD  Referring MD: Nina Man, FNP   CC: new patient evaluation for hypertension  History of Present Illness:    [name] is a 70 y.o. female with a hx of metabolic syndrome, hypertension, hypothyroidism, diabetes mellitus who is seen as a new consult at the request of Nina Man, FNP for the evaluation and management of hypertension.  Spanish interpreter present and assisted with interview.  I unfortunately do not have a copy of recent notes. She does bring with her limited information from [place], which we reviewed.  Cardiovascular risk factors: Prior clinical ASCVD: None. She has been unable to see a cardiologist for 3 years. Previously, she was followed by cardiology in [place] for hypertension (dx 15 years ago).    Metabolic syndrome/Obesity: Meets criteria for metabolic syndrome. +Obesity, +type II diabetes, +dyslipidemia, +abdominal adiposity. While living here, she notes gaining more weight due to her thyroid issues. Of note, she reports that her cholesterol and triglycerides are typically high, even if she tries to limit her carbohydrate intake. She was instructed to take Omega-3 supplements three times daily. Tobacco use history: Former smoker, during her years at [employer]. Prior pertinent testing and/or incidental findings: Echo in 2017 was normal Exercise level: In 10/2020 she had a fall, which prompted her physical therapy. When she goes shopping she makes sure to hold onto the cart. Otherwise she feels unsteady while walking. She is considering purchasing an exercise machine to be able to walk or pedal while sitting. Additionally, after walking for a block she feels very short of breath and fatigued, which she attributes to being overweight. Current diet: She has been  trying to follow a diet, and is using substitutes for sugar. Her current supplements include fish oil, tumeric, folic acid, and collagen.  She denies any palpitations, chest pain, lightheadedness, headaches, syncope, orthopnea, PND, or lower extremity edema.  Past Medical History:  Diagnosis Date   Diabetes mellitus without complication ([place])    Hypertension    Hypothyroidism    Osteoarthritis     History reviewed. No pertinent surgical history.  Current Medications: Current Outpatient Medications on File Prior to Visit  Medication Sig   acetaminophen (TYLENOL) 650 MG CR tablet Take 650 mg by mouth every 8 (eight) hours as needed for pain.   atorvastatin (LIPITOR) 20 MG tablet Take 20 mg by mouth at bedtime.   cholecalciferol (VITAMIN D3) 25 MCG (1000 UNIT) tablet Take 1,000 Units by mouth daily.   EQ ASPIRIN ADULT LOW DOSE 81 MG EC tablet Take 81 mg by mouth daily.   EUTHYROX 75 MCG tablet Take 1 tablet (75 mcg total) by mouth daily.   FOLIC ACID PO Take by mouth.   losartan-hydrochlorothiazide (HYZAAR) 50-12.5 MG tablet Take 1 tablet by mouth daily.   metFORMIN (GLUCOPHAGE-XR) 500 MG 24 hr tablet Take 1 tablet (500 mg total) by mouth daily.   Omega-3 Fatty Acids (FISH OIL) 1000 MG CPDR Take 1 tablet by mouth in the morning, at noon, and at bedtime.   No current facility-administered medications on file prior to visit.     Allergies:   Patient has no known allergies.   Social History   Tobacco Use   Smoking status: Never   Smokeless tobacco: Never  Substance Use Topics   Alcohol use: Not  Currently    Family History: No premature CAD history  ROS:   Please see the history of present illness.  Additional pertinent ROS: Constitutional: Positive for fatigue. Negative for chills, fever, night sweats, unintentional weight loss  HENT: Negative for ear pain and hearing loss.   Eyes: Negative for loss of vision and eye pain.  Respiratory: Positive for exertional shortness of  breath. Negative for cough, sputum, wheezing.   Cardiovascular: See HPI. Gastrointestinal: Negative for abdominal pain, melena, and hematochezia.  Genitourinary: Negative for dysuria and hematuria.  Musculoskeletal: Positive for fall, gait instability. Negative for myalgias.  Skin: Negative for itching and rash.  Neurological: Negative for focal weakness, focal sensory changes and loss of consciousness.  Endo/Heme/Allergies: Does not bruise/bleed easily.     EKGs/Labs/Other Studies Reviewed:    The following studies were reviewed today: No prior cardiovascular studies available.  EKG:  EKG is personally reviewed.   08/07/2021: NSR at 69 bpm  Recent Labs: 07/23/2021: BUN 23; Creatinine, Ser 1.19; Potassium 4.4; Sodium 141; TSH 2.36   Recent Lipid Panel    Component Value Date/Time   CHOL 103 07/23/2021 1518   TRIG 271.0 (H) 07/23/2021 1518   HDL 36.70 (L) 07/23/2021 1518   CHOLHDL 3 07/23/2021 1518   VLDL 54.2 (H) 07/23/2021 1518   LDLDIRECT 45.0 07/23/2021 1518    Physical Exam:    VS:  BP 128/76 (BP Location: Left Arm, Patient Position: Sitting, Cuff Size: Large)   Pulse 69   Ht 5' (1.524 m)   Wt 202 lb 9.6 oz (91.9 kg)   BMI 39.57 kg/m     Wt Readings from Last 3 Encounters:  08/07/21 202 lb 9.6 oz (91.9 kg)  07/31/21 208 lb (94.3 kg)  07/23/21 208 lb (94.3 kg)    GEN: Well nourished, well developed in no acute distress HEENT: Normal, moist mucous membranes NECK: No JVD CARDIAC: regular rhythm, normal S1 and S2, no rubs or gallops. No murmur. VASCULAR: Radial and DP pulses 2+ bilaterally. No carotid bruits RESPIRATORY:  Clear to auscultation without rales, wheezing or rhonchi  ABDOMEN: Soft, non-tender, non-distended MUSCULOSKELETAL:  Ambulates independently SKIN: Warm and dry, no edema NEUROLOGIC:  Alert and oriented x 3. No focal neuro deficits noted. PSYCHIATRIC:  Normal affect    ASSESSMENT:    1. Primary hypertension   2. Metabolic syndrome   3.  Type 2 diabetes mellitus without complication, without long-term current use of insulin (HCC)   4. Class 2 severe obesity due to excess calories with serious comorbidity and body mass index (BMI) of 39.0 to 39.9 in adult [facility])   5. Cardiac risk counseling   6. Counseling on health promotion and disease prevention    PLAN:    Metabolic syndrome Type II diabetes, not on insulin Obesity Abdominal adiposity Dyslipidemia -discussed metabolic syndrome. She is interested in weight loss. Currently on only metformin. She would be a good candidate for GLP1RA. We discussed this today. I am unfamiliar with her health plan and will reach out to our pharmacy team to see if this would be affordable for her. -lipids reviewed, TG elevated ta 271, HDL low at 36, consistent with metabolic syndrome. LDL 45. Weight loss most beneficial to improving lipid profile.  -continue atorvastatin, aspirin for prevention given diabetes  Hypertension -BP well controlled today -continue losartan-HCTZ  Cardiac risk counseling and prevention recommendations: -recommend heart healthy/Mediterranean diet, with whole grains, fruits, vegetable, fish, lean meats, nuts, and olive oil. Limit salt. -recommend moderate walking, 3-5 times/week  for 30-50 minutes each session. Aim for at least 150 minutes.week. Goal should be pace of 3 miles/hours, or walking 1.5 miles in 30 minutes -recommend avoidance of tobacco products. Avoid excess alcohol. -ASCVD risk score: The ASCVD Risk score (Arnett DK, et al., 2019) failed to calculate for the following reasons:   The valid total cholesterol range is 130 to 320 mg/dL    Reviewed her supplements, plans to continue only omega 3 fatty acids  Plan for follow up: 12 months or sooner as needed for CV management, if we can get her on GLP1RA will follow up closely on dosing  Nina Dresser, MD, PhD, [facility]    Medication Adjustments/Labs and Tests  Ordered: Current medicines are reviewed at length with the patient today.  Concerns regarding medicines are outlined above.   Orders Placed This Encounter  Procedures   EKG 12-Lead    No orders of the defined types were placed in this encounter.  Patient Instructions  Medication Instructions:  Your Physician recommend you continue on your current medication as directed.    *If you need a refill on your cardiac medications before your next appointment, please call your pharmacy*   Lab Work: None ordered today   Testing/Procedures: None ordered today   Follow-Up: At [facility], you and your health needs are our priority.  As part of our continuing mission to provide you with exceptional heart care, we have created designated Provider Care Teams.  These Care Teams include your primary Cardiologist (physician) and Advanced Practice Providers (APPs -  Physician Assistants and Nurse Practitioners) who all work together to provide you with the care you need, when you need it.  We recommend signing up for the patient portal called "MyChart".  Sign up information is provided on this After Visit Summary.  MyChart is used to connect with patients for Virtual Visits (Telemedicine).  Patients are able to view lab/test results, encounter notes, upcoming appointments, etc.  Non-urgent messages can be sent to your provider as well.   To learn more about what you can do with MyChart, go to [email or web address].    Your next appointment:   1 year(s)  The format for your next appointment:   In Person  Provider:   Buford Dresser, MD      [facility] Stumpf,acting as a scribe for Nina Dresser, MD.,have documented all relevant documentation on the behalf of Nina Dresser, MD,as directed by  Nina Dresser, MD while in the presence of Nina Dresser, MD.  I, Nina Dresser, MD, have reviewed all documentation for this visit. The documentation on  08/07/21 for the exam, diagnosis, procedures, and orders are all accurate and complete.   Signed, Nina Dresser, MD PhD 08/07/2021 1:25 PM    [facility]

## 2021-08-07 NOTE — Patient Instructions (Signed)

## 2021-08-21 ENCOUNTER — Ambulatory Visit: Payer: 59 | Admitting: Physical Therapy

## 2021-08-21 ENCOUNTER — Other Ambulatory Visit: Payer: Self-pay

## 2021-08-21 ENCOUNTER — Encounter: Payer: Self-pay | Admitting: Physical Therapy

## 2021-08-21 DIAGNOSIS — G8929 Other chronic pain: Secondary | ICD-10-CM

## 2021-08-21 DIAGNOSIS — R293 Abnormal posture: Secondary | ICD-10-CM

## 2021-08-21 DIAGNOSIS — M545 Low back pain, unspecified: Secondary | ICD-10-CM

## 2021-08-21 DIAGNOSIS — M6281 Muscle weakness (generalized): Secondary | ICD-10-CM

## 2021-08-21 DIAGNOSIS — M25512 Pain in left shoulder: Secondary | ICD-10-CM | POA: Diagnosis not present

## 2021-08-21 NOTE — Therapy (Signed)
[place], [place], [address] Phone: [phone]   Fax:  [phone]  Physical Therapy Treatment  Patient Details  Name: Nina Patterson MRN: [id number] Date of Birth: [birth year] Referring Provider (PT): Marybelle Killings, MD   Encounter Date: 08/21/2021   PT End of Session - 08/21/21 1542     Visit Number 17    Number of Visits 30    Date for PT Re-Evaluation 09/22/21   extended   Authorization Type [employer] health plan - Aut required after visit 20    Authorization - Visit Number --   same as visit number   Authorization - Number of Visits 30    PT Start Time 1545    PT Stop Time 1626    PT Time Calculation (min) 41 min    Activity Tolerance Patient tolerated treatment well    Behavior During Therapy [facility] for tasks assessed/performed             Past Medical History:  Diagnosis Date   Diabetes mellitus without complication ([place])    Hypertension    Hypothyroidism    Osteoarthritis     History reviewed. No pertinent surgical history.  There were no vitals filed for this visit.   Subjective Assessment - 08/21/21 1546     Subjective Pt reports that she has been doing many exercises at home and that she is feeling much better.  Her neck and shoulder pain is 2/10    Patient is accompained by: Interpreter   Ipad   Pertinent History Significant PMH: diabetes, hypertension and hypothyroidism             [facility] Adult PT Treatment/Exercise:   Therapeutic Exercise to strengthen hip and core to reduce low back and bil hip pain/R LE pain.  Pt cued for form and pacing throughout: - nu-step L7 53mwhile taking subjective and planning session with patient - corner stretch 45'' x2 - X arm stretch 45'' x2 - bil shoulder ER with scapular retraction RTB - 3x10 - S/L ER in R S/L - 3x13 - 3# - isometric abdominal squeeze - 10'' 6x - S/L hip abduction - 3x10 ea - Sit to stand - 3x5 25#    (Check goals next visit)  NOT TODAY - Prone row - 2x10 5# - Prone ext - 2x10 3# - prone T - 2x15     PT Long Term Goals - 08/02/21 1013       PT LONG TERM GOAL #1   Title ALL LTG TARGET DATE: 12/31    Status New      PT LONG TERM GOAL #2   Title Nina GVerlee Popewill be able to pick up 45# from floor to simulate lifting her 153year old grandson, not limited by pain  EVAL: limited    Baseline 10/4: unable, but improving    Status On-going      PT LONG TERM GOAL #3   Title Nina GLoetta Connelleywill be able to navigate 10 steps using reciprocal pattern, not limited by pain, to enable community ambulation  EVAL: limited by pain    Baseline 10/4: MET    Status Achieved      PT LONG TERM GOAL #4   Title Nina GJamiracle Avantswill improve FOTO score from 48 (on evaluation) to 61 as a proxy for functional improvement    Baseline 10/4: 54    Status On-going  PT LONG TERM GOAL #5   Title Nina Patterson will improve 30'' STS (MCID 2) to >/= 10x to show improved LE strength and improved transfers  EVAL: 8x    Baseline 10/4: 9x    Status On-going      PT LONG TERM GOAL #6   Title Nina Patterson will improve the following MMTs to >/= 4/5 to show improvement in strength:  L shoulder ER  EVAL: 3/5 with pain      PT LONG TERM GOAL #7   Title Nina Patterson will demonstrate >135 degrees of active ROM in flexion to allow completion of activities involving reaching [place], not limited by pain  EVAL: 120 degrees with pain    Status Revised      PT LONG TERM GOAL #8   Title Nina Patterson will report >/= 50% decrease in pain from evaluation  EVAL: 8/10 max shoulder pain    Status Revised                   Plan - 08/21/21 1639     Clinical Impression Statement Overall, Nina Patterson is progressing well with therapy.  Pt reports no increase in baseline pain following therapy.  Today we concentrated on core  strengthening, rotator cuff strengthening, and hip strengthening.  Pt shows continued progress with strength and endurance of both core, hip, and L shoulder with continued reduced baseline pain.  We will continue to progress, possibly adding in more OH exercises in the next several visits.  Pt will continue to benefit from skilled physical therapy to address remaining deficits and achieve listed goals.  Continue per POC.    Personal Factors and Comorbidities Comorbidity 3+    Comorbidities Significant PMH: diabetes, hypertension and hypothyroidism    Stability/Clinical Decision Making Stable/Uncomplicated    Rehab Potential Good    PT Frequency 2x / week    PT Duration 8 weeks    PT Treatment/Interventions ADLs/Self Care Home Management;Iontophoresis 11m/ml Dexamethasone;Therapeutic activities;Therapeutic exercise;Neuromuscular re-education;Manual techniques;Dry needling;Vasopneumatic Device;Joint Manipulations;Spinal Manipulations;Gait training   ADLs/Self Care Home Management;Aquatic Therapy;Therapeutic activities;Therapeutic exercise;Neuromuscular re-education;Manual techniques;Iontophoresis 457mml Dexamethasone;Dry needling;Gait training; Traction;Spinal Manipulations;Joint Manipulations   PT Next Visit Plan Get FOTO and create goal, complete 3077 sit to stand and create goal (or other functional strength goal), PPT progression, core strength/hip strength progression, D/L progression?    PT Home Exercise Plan DV[place]nd Agree with Plan of Care Patient             Patient will benefit from skilled therapeutic intervention in order to improve the following deficits and impairments:  Pain, Decreased strength  Visit Diagnosis: Chronic left shoulder pain  Chronic right-sided low back pain without sciatica  Muscle weakness  Abnormal posture     Problem List Patient Active Problem List   Diagnosis Date Noted   Primary hypertension 1116/06/9603 Metabolic syndrome  1154/05/8118 Type 2 diabetes mellitus without complication, without long-term current use of insulin ([facility]11/15/2022   Class 2 severe obesity due to excess calories with serious comorbidity and body mass index (BMI) of 39.0 to 39.9 in adult ([facility]/15/2022   Other spondylosis with radiculopathy, cervical region 07/31/2021   Facet degeneration of lumbar region 04/18/2021    KaMathis DadPT 08/21/2021, 4:39 PM  [facility]hone: [phone] Fax:  [phone]: SuSherisse FulliloveRN:  [id number] Date of Birth: 03/12/1951

## 2021-08-23 ENCOUNTER — Other Ambulatory Visit: Payer: Self-pay

## 2021-08-23 ENCOUNTER — Ambulatory Visit: Payer: 59 | Attending: Orthopaedic Surgery | Admitting: Physical Therapy

## 2021-08-23 ENCOUNTER — Encounter: Payer: Self-pay | Admitting: Physical Therapy

## 2021-08-23 DIAGNOSIS — M6281 Muscle weakness (generalized): Secondary | ICD-10-CM

## 2021-08-23 DIAGNOSIS — G8929 Other chronic pain: Secondary | ICD-10-CM | POA: Diagnosis present

## 2021-08-23 DIAGNOSIS — M25512 Pain in left shoulder: Secondary | ICD-10-CM | POA: Diagnosis not present

## 2021-08-23 DIAGNOSIS — M545 Low back pain, unspecified: Secondary | ICD-10-CM | POA: Insufficient documentation

## 2021-08-23 DIAGNOSIS — R293 Abnormal posture: Secondary | ICD-10-CM | POA: Diagnosis present

## 2021-08-23 NOTE — Therapy (Signed)
[place], [place], [address] Phone: [phone]   Fax:  [phone]  Physical Therapy Treatment  Patient Details  Name: Nina Patterson MRN: [id number] Date of Birth: [birth year] Referring Provider (PT): Marybelle Killings, MD   Encounter Date: 08/23/2021   PT End of Session - 08/23/21 1535     Visit Number 18    Number of Visits 30    Date for PT Re-Evaluation 09/22/21   extended   Authorization Type [employer] health plan - Aut required after visit 52    Authorization - Visit Number --   same as visit number   Authorization - Number of Visits 30    PT Start Time 1535    PT Stop Time 1614    PT Time Calculation (min) 39 min    Activity Tolerance Patient tolerated treatment well    Behavior During Therapy [facility] for tasks assessed/performed             Past Medical History:  Diagnosis Date   Diabetes mellitus without complication ([place])    Hypertension    Hypothyroidism    Osteoarthritis     History reviewed. No pertinent surgical history.  There were no vitals filed for this visit.   Subjective Assessment - 08/23/21 1541     Subjective Pt reports that she had significant muscle soreness after last session.  Her neck and shoulder pain is 2/10.  Her low back pain is is 3/10    Patient is accompained by: Interpreter   Ipad   Pertinent History Significant PMH: diabetes, hypertension and hypothyroidism              Objective:  FOTO: 21  30'' STS: 14x with UE   ER MMT: L shoulder 4/5  R shoulder flexion: 143 degrees    [facility] Adult PT Treatment/Exercise:   Therapeutic Exercise to strengthen hip and core to reduce low back and bil hip pain/R LE pain.  Pt cued for form and pacing throughout: - nu-step L7 14mwhile taking subjective and planning session with patient - corner stretch 45'' x2 - X arm stretch 45'' x2  Therapeutic Activity - collecting information for goals,  checking progress, FOTO (extended time d/t translation) and reviewing with patient       PT Long Term Goals - 08/23/21 1556       PT LONG TERM GOAL #1   Title ALL LTG TARGET DATE: 12/31    Status New      PT LONG TERM GOAL #2   Title Darshay GTrenda Corlisswill be able to pick up 45# from floor to simulate lifting her 182year old grandson, not limited by pain  EVAL: limited    Baseline 10/4: unable, but improving 12/1: not attempted, will integrate into therex    Status On-going      PT LONG TERM GOAL #3   Title Tejah GKeilana Morlockwill be able to navigate 10 steps using reciprocal pattern, not limited by pain, to enable community ambulation  EVAL: limited by pain    Baseline 10/4: MET    Status Achieved      PT LONG TERM GOAL #4   Title Lizbet GQianna Clagettwill improve FOTO score from 48 (on evaluation) to 61 as a proxy for functional improvement    Baseline 10/4: 54 12/1: 551   Status On-going      PT LONG TERM GOAL #5   Title Carneshia  Morganne Haile will improve 30'' STS (MCID 2) to >/= 10x to show improved LE strength and improved transfers  EVAL: 8x    Baseline 10/4: 9x 12/1: 14 w/ UE support    Status On-going      PT LONG TERM GOAL #6   Title Edita Latandra Loureiro will improve the following MMTs to >/= 4/5 to show improvement in strength:  L shoulder ER  EVAL: 3/5 with pain    Baseline 12/1: MET 4/5    Status Achieved      PT LONG TERM GOAL #7   Title Seraya Billy Rocco will demonstrate >135 degrees of active ROM in flexion to allow completion of activities involving reaching [place], not limited by pain  EVAL: 120 degrees with pain    Baseline 12/1: MET 140 degrees    Status Achieved      PT LONG TERM GOAL #8   Title Jacyln Eston Esters will report >/= 50% decrease in pain from evaluation  EVAL: 8/10 max shoulder pain    Status On-going                   Plan - 08/23/21 1603     Clinical Impression  Statement Shamiah Letzy Gullickson has progressed well with therapy.  Improved impairments include: shoulder strength, low back pain and shoulder pain.  Functional improvements include: ability to reach [facility], comb hair, and reach behind back, improved community ambulation.  Progressions needed include: continued hip and core strengthening.  Barriers to progress include: none.  Please see baseline and/or status section in "Goals" for specific progress on short term and long term goals established at evaluation.  I recommend continuation of PT to allow completion of remaining goals and continued functional progression.    Personal Factors and Comorbidities Comorbidity 3+    Comorbidities Significant PMH: diabetes, hypertension and hypothyroidism    Stability/Clinical Decision Making Stable/Uncomplicated    Rehab Potential Good    PT Frequency 2x / week    PT Duration 8 weeks    PT Treatment/Interventions ADLs/Self Care Home Management;Iontophoresis 61m/ml Dexamethasone;Therapeutic activities;Therapeutic exercise;Neuromuscular re-education;Manual techniques;Dry needling;Vasopneumatic Device;Joint Manipulations;Spinal Manipulations;Gait training   ADLs/Self Care Home Management;Aquatic Therapy;Therapeutic activities;Therapeutic exercise;Neuromuscular re-education;Manual techniques;Iontophoresis 426mml Dexamethasone;Dry needling;Gait training; Traction;Spinal Manipulations;Joint Manipulations   PT Next Visit Plan Get FOTO and create goal, complete 3056 sit to stand and create goal (or other functional strength goal), PPT progression, core strength/hip strength progression, D/L progression?    PT Home Exercise Plan DV[place]nd Agree with Plan of Care Patient             Patient will benefit from skilled therapeutic intervention in order to improve the following deficits and impairments:  Pain, Decreased strength  Visit Diagnosis: Chronic left shoulder pain  Chronic right-sided low back  pain without sciatica  Muscle weakness  Abnormal posture     Problem List Patient Active Problem List   Diagnosis Date Noted   Primary hypertension 1196/75/9163 Metabolic syndrome 1184/66/5993 Type 2 diabetes mellitus without complication, without long-term current use of insulin ([facility]11/15/2022   Class 2 severe obesity due to excess calories with serious comorbidity and body mass index (BMI) of 39.0 to 39.9 in adult ([facility]11/15/2022   Other spondylosis with radiculopathy, cervical region 07/31/2021   Facet degeneration of lumbar region 04/18/2021    KaMathis DadPT 08/23/2021, 4:17 PM  [place]  [place], [place], [address] Phone: [phone]   Fax:  [phone]  Name: Razan Siler MRN: [id number] Date of Birth: 1950/10/07

## 2021-08-28 ENCOUNTER — Encounter: Payer: Self-pay | Admitting: Physical Therapy

## 2021-08-28 ENCOUNTER — Ambulatory Visit: Payer: 59 | Admitting: Physical Therapy

## 2021-08-28 ENCOUNTER — Other Ambulatory Visit: Payer: Self-pay

## 2021-08-28 DIAGNOSIS — M25512 Pain in left shoulder: Secondary | ICD-10-CM | POA: Diagnosis not present

## 2021-08-28 DIAGNOSIS — M6281 Muscle weakness (generalized): Secondary | ICD-10-CM

## 2021-08-28 DIAGNOSIS — M545 Low back pain, unspecified: Secondary | ICD-10-CM

## 2021-08-28 DIAGNOSIS — G8929 Other chronic pain: Secondary | ICD-10-CM

## 2021-08-28 NOTE — Therapy (Signed)
[place], [place], [address] Phone: [phone]   Fax:  [phone]  Physical Therapy Treatment  Patient Details  Name: Nina Patterson MRN: [id number] Date of Birth: [birth year] Referring Provider (PT): Nina Killings, MD   Encounter Date: 08/28/2021   PT End of Session - 08/28/21 1528     Visit Number 19    Number of Visits 30    Date for PT Re-Evaluation 09/22/21   extended   Authorization Type [employer] - Aut required after visit 38    Authorization - Visit Number --   same as visit number   Authorization - Number of Visits 30    PT Start Time 0330    PT Stop Time 0412    PT Time Calculation (min) 42 min    Activity Tolerance Patient tolerated treatment well    Behavior During Therapy [facility] for tasks assessed/performed             Past Medical History:  Diagnosis Date   Diabetes mellitus without complication ([place])    Hypertension    Hypothyroidism    Osteoarthritis     History reviewed. No pertinent surgical history.  There were no vitals filed for this visit.   Subjective Assessment - 08/28/21 1534     Subjective Pt reports that she has very little pain today. Shoulder pain 1/10, her back pain is 1/10.    Patient is accompained by: Interpreter   Ipad   Pertinent History Significant PMH: diabetes, hypertension and hypothyroidism             [facility] Adult PT Treatment/Exercise:   Therapeutic Exercise to strengthen hip and core to reduce low back and bil hip pain/R LE pain.  Pt cued for form and pacing throughout: - nu-step L7 71mwhile taking subjective and planning session with patient - LTR - 20x - S/L ER in R S/L - 3x15 - 3# - S/L flexion - 2# - 3x10 - isometric abdominal squeeze - 10'' 5x - S/L hip abduction - 3x10 ea - Sit to stand - 3x8 25# - D/L from 8'' box - 3x10 - 15#   NOT TODAY - bil shoulder ER with scapular retraction RTB - 3x10 - Prone row -  2x10 5# - Prone ext - 2x10 3# - prone T - 2x15 - corner stretch 463' x2 - X arm stretch 483' x2      PT Long Term Goals - 08/23/21 1556       PT LONG TERM GOAL #1   Title ALL LTG TARGET DATE: 12/31    Status New      PT LONG TERM GOAL #2   Title Nina GAveah Castellwill be able to pick up 45# from floor to simulate lifting her 163year old grandson, not limited by pain  EVAL: limited    Baseline 10/4: unable, but improving 12/1: not attempted, will integrate into therex    Status On-going      PT LONG TERM GOAL #3   Title Nina GCaprice Mccaffreywill be able to navigate 10 steps using reciprocal pattern, not limited by pain, to enable community ambulation  EVAL: limited by pain    Baseline 10/4: MET    Status Achieved      PT LONG TERM GOAL #4   Title SLaurenswill improve FOTO score from 48 (on evaluation) to 61 as a proxy for functional improvement  Baseline 10/4: 54 12/1: 21    Status On-going      PT LONG TERM GOAL #5   Title Nina Patterson will improve 30'' STS (MCID 2) to >/= 10x to show improved LE strength and improved transfers  EVAL: 8x    Baseline 10/4: 9x 12/1: 14 w/ UE support    Status On-going      PT LONG TERM GOAL #6   Title Nina Patterson will improve the following MMTs to >/= 4/5 to show improvement in strength:  L shoulder ER  EVAL: 3/5 with pain    Baseline 12/1: MET 4/5    Status Achieved      PT LONG TERM GOAL #7   Title Nina Patterson will demonstrate >135 degrees of active ROM in flexion to allow completion of activities involving reaching [place], not limited by pain  EVAL: 120 degrees with pain    Baseline 12/1: MET 140 degrees    Status Achieved      PT LONG TERM GOAL #8   Title Nina Patterson will report >/= 50% decrease in pain from evaluation  EVAL: 8/10 max shoulder pain    Status On-going                   Plan - 08/28/21 1706     Clinical  Impression Statement Overall, Nina Patterson is progressing well with therapy.  Pt reports no increase in baseline pain following therapy.  Today we concentrated on core strengthening, hip strengthening, and functional strengthening.  Pt continues to show increased activity tolerance and decreased pain.  We moved to more functional movements today, adding in a 15# hip hinge to good effect.  We will continue to progress this in the next few visits.  Pt will continue to benefit from skilled physical therapy to address remaining deficits and achieve listed goals.  Continue per POC.    Personal Factors and Comorbidities Comorbidity 3+    Comorbidities Significant PMH: diabetes, hypertension and hypothyroidism    Stability/Clinical Decision Making Stable/Uncomplicated    Rehab Potential Good    PT Frequency 2x / week    PT Duration 8 weeks    PT Treatment/Interventions ADLs/Self Care Home Management;Iontophoresis 4mg /ml Dexamethasone;Therapeutic activities;Therapeutic exercise;Neuromuscular re-education;Manual techniques;Dry needling;Vasopneumatic Device;Joint Manipulations;Spinal Manipulations;Gait training   ADLs/Self Care Home Management;Aquatic Therapy;Therapeutic activities;Therapeutic exercise;Neuromuscular re-education;Manual techniques;Iontophoresis 4mg /ml Dexamethasone;Dry needling;Gait training; Traction;Spinal Manipulations;Joint Manipulations   PT Next Visit Plan Get FOTO and create goal, complete 89'' sit to stand and create goal (or other functional strength goal), PPT progression, core strength/hip strength progression, D/L progression?    PT Home Exercise Plan [place] and Agree with Plan of Care Patient             Patient will benefit from skilled therapeutic intervention in order to improve the following deficits and impairments:  Pain, Decreased strength  Visit Diagnosis: Chronic left shoulder pain  Chronic right-sided low back pain without sciatica  Muscle  weakness     Problem List Patient Active Problem List   Diagnosis Date Noted   Primary hypertension 25/75/0518   Metabolic syndrome 33/58/2518   Type 2 diabetes mellitus without complication, without long-term current use of insulin ([place]) 08/07/2021   Class 2 severe obesity due to excess calories with serious comorbidity and body mass index (BMI) of 39.0 to 39.9 in adult [facility]) 08/07/2021   Other spondylosis with radiculopathy, cervical region 07/31/2021   Facet degeneration of lumbar region 04/18/2021  Mathis Dad, PT 08/28/2021, 5:07 PM  [facility] [address] [place], [place], [address] Phone: [phone]   Fax:  [phone]  Name: Nina Patterson MRN: [id number] Date of Birth: 10-25-1950

## 2021-08-30 ENCOUNTER — Encounter: Payer: Self-pay | Admitting: Physical Therapy

## 2021-08-30 ENCOUNTER — Ambulatory Visit: Payer: 59 | Admitting: Physical Therapy

## 2021-08-30 DIAGNOSIS — G8929 Other chronic pain: Secondary | ICD-10-CM

## 2021-08-30 DIAGNOSIS — M25512 Pain in left shoulder: Secondary | ICD-10-CM

## 2021-08-30 DIAGNOSIS — M545 Low back pain, unspecified: Secondary | ICD-10-CM

## 2021-08-30 DIAGNOSIS — M6281 Muscle weakness (generalized): Secondary | ICD-10-CM

## 2021-08-30 NOTE — Therapy (Signed)
[place], [place], [address] Phone: [phone]   Fax:  [phone]  Physical Therapy Treatment  Patient Details  Name: Nina Patterson MRN: [id number] Date of Birth: November 20, 1950 Referring Provider (PT): Marybelle Killings, MD   Encounter Date: 08/30/2021   PT End of Session - 08/30/21 1350     Visit Number 20    Number of Visits 30    Date for PT Re-Evaluation 09/22/21   extended   Authorization Type [employer] plan - Aut required after visit 92    Authorization - Visit Number --   same as visit number   Authorization - Number of Visits 30    PT Start Time 1350    PT Stop Time 1432    PT Time Calculation (min) 42 min    Activity Tolerance Patient tolerated treatment well    Behavior During Therapy [facility] for tasks assessed/performed             Past Medical History:  Diagnosis Date   Diabetes mellitus without complication ([place])    Hypertension    Hypothyroidism    Osteoarthritis     History reviewed. No pertinent surgical history.  There were no vitals filed for this visit.   Subjective Assessment - 08/30/21 1355     Subjective Pt reports that she has very little pain today. Shoulder pain 1/10, her back pain is 2/10.    Patient is accompained by: Interpreter   Ipad   Pertinent History Significant PMH: diabetes, hypertension and hypothyroidism              [facility] Adult PT Treatment/Exercise:   Therapeutic Exercise to strengthen hip and core to reduce low back and bil hip pain/R LE pain.  Pt cued for form and pacing throughout: - nu-step L7 59mwhile taking subjective and planning session with patient - LTR - 20x - S/L ER in R S/L - 3x15 - 3# (increase next visit) - S/L flexion - 2# - 3x10 (Add S/L abd) - Bridge with shoulder ext - 5x5 - S/L hip abduction - 3x10 ea - Sit to stand - 3x8 25# (not today) - bird dog 10x - D/L from 8'' box - 3x10 - 20#   NOT TODAY - bil  shoulder ER with scapular retraction RTB - 3x10 - Prone row - 2x10 5# - Prone ext - 2x10 3# - prone T - 2x15 - corner stretch 45'' x2 - X arm stretch 45'' x2 - isometric abdominal squeeze - 10'' 5x     PT Long Term Goals - 08/23/21 1556       PT LONG TERM GOAL #1   Title ALL LTG TARGET DATE: 12/31    Status New      PT LONG TERM GOAL #2   Title Nina GJaylynne Birkheadwill be able to pick up 45# from floor to simulate lifting her 175year old grandson, not limited by pain  EVAL: limited    Baseline 10/4: unable, but improving 12/1: not attempted, will integrate into therex    Status On-going      PT LONG TERM GOAL #3   Title Nina GMaheen Cwiklawill be able to navigate 10 steps using reciprocal pattern, not limited by pain, to enable community ambulation  EVAL: limited by pain    Baseline 10/4: MET    Status Achieved      PT LONG TERM GOAL #4   Title Nina GStarleen Arms  Norma Patterson will improve FOTO score from 48 (on evaluation) to 61 as a proxy for functional improvement    Baseline 10/4: 54 12/1: 54    Status On-going      PT LONG TERM GOAL #5   Title Nina Patterson will improve 30'' STS (MCID 2) to >/= 10x to show improved LE strength and improved transfers  EVAL: 8x    Baseline 10/4: 9x 12/1: 14 w/ UE support    Status On-going      PT LONG TERM GOAL #6   Title Nina Patterson will improve the following MMTs to >/= 4/5 to show improvement in strength:  L shoulder ER  EVAL: 3/5 with pain    Baseline 12/1: MET 4/5    Status Achieved      PT LONG TERM GOAL #7   Title Nina Patterson will demonstrate >135 degrees of active ROM in flexion to allow completion of activities involving reaching [place], not limited by pain  EVAL: 120 degrees with pain    Baseline 12/1: MET 140 degrees    Status Achieved      PT LONG TERM GOAL #8   Title Nina Patterson will report >/= 50% decrease in pain from evaluation  EVAL: 8/10 max  shoulder pain    Status On-going                   Plan - 08/30/21 1528     Clinical Impression Statement Overall, Nina Patterson is progressing well with therapy.  Pt reports no increase in baseline pain following therapy.  Today we concentrated on core strengthening, rotator cuff strengthening, and periscapular strengthening.  Pt continues to show functional improvement in lifting.  We were able to progress weight with hip hinge today and also add in bird dog.  Pt shows motor control and strength deficit with bird dog and this will be good to work on in the coming visits.  Pt will continue to benefit from skilled physical therapy to address remaining deficits and achieve listed goals.  Continue per POC.    Personal Factors and Comorbidities Comorbidity 3+    Comorbidities Significant PMH: diabetes, hypertension and hypothyroidism    Stability/Clinical Decision Making Stable/Uncomplicated    Rehab Potential Good    PT Frequency 2x / week    PT Duration 8 weeks    PT Treatment/Interventions ADLs/Self Care Home Management;Iontophoresis 78m/ml Dexamethasone;Therapeutic activities;Therapeutic exercise;Neuromuscular re-education;Manual techniques;Dry needling;Vasopneumatic Device;Joint Manipulations;Spinal Manipulations;Gait training   ADLs/Self Care Home Management;Aquatic Therapy;Therapeutic activities;Therapeutic exercise;Neuromuscular re-education;Manual techniques;Iontophoresis 476mml Dexamethasone;Dry needling;Gait training; Traction;Spinal Manipulations;Joint Manipulations   PT Next Visit Plan Get FOTO and create goal, complete 3022 sit to stand and create goal (or other functional strength goal), PPT progression, core strength/hip strength progression, D/L progression?    PT Home Exercise Plan [place] Agree with Plan of Care Patient             Patient will benefit from skilled therapeutic intervention in order to improve the following deficits and impairments:  Pain,  Decreased strength  Visit Diagnosis: Chronic left shoulder pain  Chronic right-sided low back pain without sciatica  Muscle weakness     Problem List Patient Active Problem List   Diagnosis Date Noted   Primary hypertension 1144/81/8563 Metabolic syndrome 1114/97/0263 Type 2 diabetes mellitus without complication, without long-term current use of insulin [place]   Class 2 severe obesity due to excess calories with serious comorbidity  and body mass index (BMI) of 39.0 to 39.9 in adult [facility]) 08/07/2021   Other spondylosis with radiculopathy, cervical region 07/31/2021   Facet degeneration of lumbar region 04/18/2021    Mathis Dad, PT 08/30/2021, 3:29 PM  [facility] [address] [place], [place], [address] Phone: [phone]   Fax:  [phone]  Name: Nina Patterson MRN: [id number] Date of Birth: 05/26/1951

## 2021-09-04 ENCOUNTER — Ambulatory Visit: Payer: 59 | Admitting: Physical Therapy

## 2021-09-06 ENCOUNTER — Ambulatory Visit: Payer: 59 | Admitting: Physical Therapy

## 2021-09-06 NOTE — Progress Notes (Signed)
Patient ID: Nina Patterson                 DOB: [birth year]                    MRN: [id number]     HPI: Nina Patterson is a 70 y.o. female patient referred to pharmacy clinic by Dr. Harrell Gave to initiate weight loss therapy with GLP1-RA. PMH is significant for obesity complicated by chronic medical conditions including diabetes, hyperlipidemia and hypertension. Most recent BMI 39.4, previous BMI 40.2.  Needs endo within [place] - for transportation eases, ask [facility]  Current weight management medications:   Previously tried meds:  alli  Current meds that may affect weight: none  Baseline weight/BMI: 40.2  Insurance payor:  [employer] [place]  Diet: eats mostly home cooked meals, avoids fats, grease makes her nauseated   Breakfast - rotates between cheese tortilla or unsweetened cereal. Lunch/dinner are more chicken and rice, native [place] foods, states does not eat tortillas except at breakfast, often will have just yogurt for dinner  Exercise: PT for arthritis in the hands; treadmill 30 min/day, stretches daily  Family History: maternal grandfather and aunt were both diabetic, 1 sister deceased from cancer, patient repeatedly confirmed was neck but NOT thyroid, she also had Katherina Right syndrome.  Had 2 children, both healthy  Social History: quit smoking 30 years, only occasional alcohol, coffee daily  Labs: No results found for: HGBA1C  Wt Readings from Last 1 Encounters:  08/07/21 202 lb 9.6 oz (91.9 kg)    BP Readings from Last 1 Encounters:  08/07/21 128/76   Pulse Readings from Last 1 Encounters:  08/07/21 69       Component Value Date/Time   CHOL 103 07/23/2021 1518   TRIG 271.0 (H) 07/23/2021 1518   HDL 36.70 (L) 07/23/2021 1518   CHOLHDL 3 07/23/2021 1518   VLDL 54.2 (H) 07/23/2021 1518   LDLDIRECT 45.0 07/23/2021 1518    Past Medical History:  Diagnosis Date   Diabetes mellitus without complication ([place])    Hypertension     Hypothyroidism    Osteoarthritis     Current Outpatient Medications on File Prior to Visit  Medication Sig Dispense Refill   acetaminophen (TYLENOL) 650 MG CR tablet Take 650 mg by mouth every 8 (eight) hours as needed for pain.     atorvastatin (LIPITOR) 20 MG tablet Take 20 mg by mouth at bedtime.     cholecalciferol (VITAMIN D3) 25 MCG (1000 UNIT) tablet Take 1,000 Units by mouth daily.     EQ ASPIRIN ADULT LOW DOSE 81 MG EC tablet Take 81 mg by mouth daily.     EUTHYROX 75 MCG tablet Take 1 tablet (75 mcg total) by mouth daily. 90 tablet 2   FOLIC ACID PO Take by mouth.     losartan-hydrochlorothiazide (HYZAAR) 50-12.5 MG tablet Take 1 tablet by mouth daily.     metFORMIN (GLUCOPHAGE-XR) 500 MG 24 hr tablet Take 1 tablet (500 mg total) by mouth daily. 90 tablet 2   Omega-3 Fatty Acids (FISH OIL) 1000 MG CPDR Take 1 tablet by mouth in the morning, at noon, and at bedtime.     No current facility-administered medications on file prior to visit.    No Known Allergies   Assessment/Plan:  1. Weight loss - Patient has not met goal of at least 5% of body weight loss with comprehensive lifestyle modifications alone in the past 3-6 months.  Pharmacotherapy is appropriate to pursue as augmentation. Will start Ozempic. Confirmed patient not pregnant and no personal or family history of medullary thyroid carcinoma (MTC) or Multiple Endocrine Neoplasia syndrome type 2 (MEN 2).   Advised patient on common side effects including nausea, diarrhea, dyspepsia, decreased appetite, and fatigue. Counseled patient on reducing meal size and how to titrate medication to minimize side effects. Counseled patient to call if intolerable side effects or if experiencing dehydration, abdominal pain, or dizziness. Patient will adhere to dietary modifications and will target at least 150 minutes of moderate intensity exercise weekly.   Follow up in 3 months.   Nina Patterson PharmD CPP [place] [address] [place] [address]

## 2021-09-07 ENCOUNTER — Encounter (HOSPITAL_BASED_OUTPATIENT_CLINIC_OR_DEPARTMENT_OTHER): Payer: Self-pay

## 2021-09-07 ENCOUNTER — Other Ambulatory Visit: Payer: Self-pay

## 2021-09-07 ENCOUNTER — Ambulatory Visit (INDEPENDENT_AMBULATORY_CARE_PROVIDER_SITE_OTHER): Payer: 59 | Admitting: Pharmacist Clinician (PhC)/ Clinical Pharmacy Specialist

## 2021-09-07 VITALS — Ht 61.0 in | Wt 208.6 lb

## 2021-09-07 DIAGNOSIS — E119 Type 2 diabetes mellitus without complications: Secondary | ICD-10-CM | POA: Diagnosis not present

## 2021-09-07 NOTE — Patient Instructions (Addendum)
Tome 0.25 mg cada semana para 4 semanas.  Despues aumenta a 0.5 mg cada semana  para 4 semanas -  solamente si no problemas del estomago.  Despues aumenta a 1 mg  cada 4 semanas  Finalmente 2 mg cada semana.     Semaglutide Injection Qu es este medicamento? La SEMAGLUTIDA trata la diabetes tipo 2. Acta [employer] niveles de insulina en el cuerpo, lo cual disminuye el azcar en la sangre (glucosa). Tambin reduce la cantidad de azcar liberada en la sangre y desacelera la digestin. Tambin se puede usar para disminuir el riesgo de ataque cardaco y accidente cerebrovascular en personas con diabetes tipo 2. Con frecuencia, este medicamento se combina con cambios en la dieta y el ejercicio. Este medicamento puede ser utilizado para otros usos; si tiene alguna pregunta consulte con su proveedor de atencin mdica o con su farmacutico. MARCAS COMUNES: OZEMPIC Qu le debo informar a mi profesional de la salud antes de tomar este medicamento? Necesitan saber si usted presenta alguno de los siguientes problemas o situaciones: Tumores endocrinos (neoplasia endcrina mltiple tipo II) o si alguien en su familiar tuvo esos tumores Enfermedad ocular, problemas de la visin Antecedentes de pancreatitis Enfermedad renal Problemas estomacales Cncer de tiroides o si alguien en su familia tuvo cncer de tiroides Runner, broadcasting/film/video o inusual a la semaglutida, a otros medicamentos, alimentos, colorantes o conservantes Si est embarazada o buscando quedar embarazada Si est amamantando a un beb Cmo debo utilizar este medicamento? Este medicamento se debe inyectar debajo de la piel de la parte superior de la pierna (muslo), del rea del estmago o de la parte superior del brazo. Se administra una vez por semana (cada 7 das). Le ensearn cmo preparar y Engineer, materials. Use el medicamento exactamente como se le indique. Use su medicamento a intervalos regulares. No lo use con una  frecuencia mayor a la indicada. Si [place] este medicamento con insulina, debe [employer] medicamento y la insulina por separado. No los mezcle. No se aplique una inyeccin al lado de la otra. Cambie (rote) los sitios de inyeccin con cada inyeccin. Es importante que deseche las agujas y las jeringas usadas en un recipiente resistente a los pinchazos. No las deseche en la basura. Si no tiene un recipiente resistente a los pinchazos, llame a su farmacutico o a su equipo de atencin para obtenerlo. Su farmacutico le dar una Gua del medicamento especial (MedGuide, nombre en ingls) con cada receta y en cada ocasin que la vuelva a surtir. Asegrese de leer esta informacin cada vez cuidadosamente. Este medicamento viene con INSTRUCCIONES DE USO. Pdale a su farmacutico que le indique cmo usar [employer]. Lea la informacin atentamente. Hable con su farmacutico o su equipo de atencin si tiene [place] pregunta. Hable con su equipo de atencin sobre el uso de este medicamento en nios. Puede requerir atencin especial. Sobredosis: Pngase en contacto inmediatamente con un centro toxicolgico o una sala de urgencia si usted cree que haya tomado demasiado medicamento. ATENCIN: [employer] es solo para usted. No comparta este medicamento con nadie. Qu sucede si me olvido de una dosis? Si se olvida una dosis, tmela lo antes posible, si es dentro de los [address]  [address] despus de la fecha en que deba tomarla. Luego tome la prxima dosis en el horario semanal habitual. Si han pasado ms de 5 das despus de [employer] dosis, no tome la dosis que se olvid. Administre la prxima dosis a la hora habitual. No se administre dosis  adicionales o dobles. Si tiene preguntas sobre una dosis que se olvid, contacte a su equipo de atencin para que le brinde asesoramiento. Qu puede interactuar con este medicamento? Otros medicamentos para la diabetes Muchos medicamentos pueden causar Probation officer de  azcar en la sangre. Estos incluyen: Bebidas con alcohol Medicamentos antivirales para el VIH o SIDA Aspirina y otros medicamentos tipo aspirina Ciertos medicamentos para la presin arterial, enfermedad cardiaca y frecuencia cardiaca irregular Cromo Diurticos Hormonas femeninas, tales como estrgenos o progestinas, pldoras anticonceptivas Fenofibrato Gemfibrozil Isoniazida Lanreotida Hormonas masculinas o esteroides anablicos IMAO, tales como [place], Eldepryl, Marplan, Nardil y Parnate Medicamentos para bajar de peso Medicamentos para alergias, asma, resfriados o tos Medicamentos para depresin, ansiedad o trastornos psicticos Niacina Nicotina AINE, medicamentos para Chief Technology Officer y la inflamacin, tales como ibuprofeno o naproxeno Octreotida Pasireotida Pentamidina Fenitona Probenecid Antibiticos del grupo de las quinolonas, tales como ciprofloxacino, levofloxacino y ofloxacino Algunos suplementos dietticos a base de Insurance claims handler esteroideos, tales como la prednisona o la cortisona Sulfametoxazol; trimetoprima Hormonas tiroideas Algunos medicamentos pueden ocultar los sntomas de advertencia de niveles bajos de azcar en la sangre (hipoglucemia). Es posible que deba monitorear ms atentamente su nivel de azcar en la sangre si est tomando uno de estos medicamentos. Estos medicamentos incluyen: Betabloqueadores, que con frecuencia se usan para la presin arterial alta o problemas cardiacos (algunos ejemplos son atenolol, metoprolol y propranolol) Clonidina Guanetidina Reserpina Puede ser que esta lista no menciona todas las posibles interacciones. Informe a su profesional de [employer] de [employer] productos a base de hierbas, medicamentos de [place] o suplementos nutritivos que est tomando. Si usted fuma, consume bebidas alcohlicas o si utiliza drogas ilegales, indqueselo tambin a su profesional de [employer]. Algunas sustancias pueden interactuar con su  medicamento. A qu debo estar atento al usar [employer]? Visite a su equipo de atencin para que revise su evolucin peridicamente. Beba abundante cantidad de lquidos mientras [place] este medicamento. Consulte con su equipo de atencin si tiene un ataque de diarrea grave, nuseas y vmitos. La prdida de demasiado lquido corporal puede hacer que sea peligroso usar [employer]. Se monitorizar una prueba llamada Hemoglobina A1C (A1C). Es un anlisis de sangre sencillo. Mide su control del nivel de azcar en la sangre durante los ltimos 2 a 3 meses. Se le realizar esta prueba cada 3 a 6 meses. Aprenda a revisar su nivel de azcar en la sangre. Conozca los sntomas del nivel bajo y alto de International aid/development worker en la sangre, y cmo controlarlos. Siempre lleve con usted una fuente rpida de azcar por si tiene sntomas de nivel bajo de azcar [employer]. Algunos ejemplos incluyen caramelos duros de azcar o tabletas de glucosa. Asegrese de que otras personas sepan que usted se puede ahogar si come o bebe cuando presenta sntomas graves de [place] bajo de azcar en la sangre, como convulsiones o prdida del conocimiento. Deben obtener ayuda mdica de inmediato. Informe a su equipo de atencin si tiene [employer] de Banker. Es posible que deba cambiar la dosis de su medicamento. Si est enfermo o hace ms ejercicio que lo habitual, es posible que necesite cambiar la dosis de su medicamento. No saltee comidas. Pregunte a su equipo de atencin si debe evitar el alcohol. Muchos productos de venta libre para la tos y el resfriado contienen azcar o alcohol. Estos pueden afectar los niveles de Banker. Los inyectores nunca deben compartirse. Incluso si se cambia la aguja, al  compartir se pueden contagiar virus como la hepatitis o el VIH. Use un brazalete o una cadena de identificacin mdica, y lleve con usted una tarjeta que describa su enfermedad, detalles de su medicamento y horario de  dosis. No debe quedar embarazada mientras est tomando [employer]. Las mujeres deben informar a su equipo de atencin si estn buscando quedar embarazadas o si creen que podran estar embarazadas. Existe la posibilidad de efectos secundarios graves en un beb sin nacer. Para obtener ms informacin, hable con su equipo de atencin. Qu efectos secundarios puedo tener al [employer] este medicamento? Efectos secundarios que debe informar a su equipo de atencin tan pronto como sea posible: Reacciones alrgicas: erupcin cutnea, comezn/picazn, urticaria, hinchazn de la cara, los labios, la lengua o la garganta Cambio en la visin Deshidratacin: aumento de la sed, boca seca, sensacin de desmayo o aturdimiento, dolor de [place], Manufacturing systems engineer oscura o marrn Problemas en la vescula biliar: dolor de estmago intenso, nuseas, vmitos, fiebre Palpitaciones cardacas: frecuencia cardiaca rpida, intensa o irregular Lesin en los riones: disminucin en la cantidad de orina, hinchazn de los tobillos, las manos o los pies Pancreatitis: dolor abdominal intenso que se extiende a la espalda o empeora despus de comer o al tacto, fiebre, nuseas, vmitos Cncer de tiroides: masa o bulto nuevo en el cuello, dolor o problemas para tragar, problemas respiratorios, ronquera Efectos secundarios que generalmente no requieren atencin mdica (debe informarlos a su equipo de atencin si persisten o si son molestos): Diarrea Prdida del apetito Investment banker, operational Vmito Puede ser que esta lista no menciona todos los posibles efectos secundarios. Comunquese a su mdico por asesoramiento mdico [employer]. Usted puede informar los efectos secundarios a la FDA por telfono al 1-800-FDA-1088. Dnde debo guardar mi medicina? Mantenga fuera del alcance de los nios. Guarde los inyectores sin abrir en el refrigerador a una temperatura de [place] 2 y 8 grados Celsius (entre 36 y 46 grados  Fahrenheit). No congele. Proteja de la luz y del Airline pilot. Despus de Higher education careers adviser por primera vez, se puede almacenar por [address] a temperatura ambiente entre 15 y 30 grados Celsius (59 y 23 grados Fahrenheit) o Airline pilot. Deseche el inyector usado [address] despus del primer uso o despus de la fecha de vencimiento, lo que suceda primero. No almacene el inyector con la aguja puesta. Si se deja la aguja puesta, es posible que se escape medicamento del Hydrographic surveyor. ATENCIN: Este folleto es un resumen. Puede ser que no cubra toda la posible informacin. Si usted tiene preguntas acerca de esta medicina, consulte con su mdico, su farmacutico o su profesional de Radiographer, therapeutic.  2022 Elsevier/Gold Standard (2021-04-09 00:00:00)

## 2021-09-11 ENCOUNTER — Encounter: Payer: Self-pay | Admitting: Physical Therapy

## 2021-09-11 ENCOUNTER — Ambulatory Visit: Payer: 59 | Admitting: Physical Therapy

## 2021-09-11 ENCOUNTER — Other Ambulatory Visit: Payer: Self-pay

## 2021-09-11 DIAGNOSIS — M25512 Pain in left shoulder: Secondary | ICD-10-CM | POA: Diagnosis not present

## 2021-09-11 DIAGNOSIS — M6281 Muscle weakness (generalized): Secondary | ICD-10-CM

## 2021-09-11 DIAGNOSIS — R293 Abnormal posture: Secondary | ICD-10-CM

## 2021-09-11 DIAGNOSIS — G8929 Other chronic pain: Secondary | ICD-10-CM

## 2021-09-11 NOTE — Therapy (Signed)
[place], [place], [address] Phone: [phone]   Fax:  [phone]  Physical Therapy Treatment  Patient Details  Name: Nina Patterson MRN: [id number] Date of Birth: [birth year] Referring Provider (PT): Marybelle Killings, MD   Encounter Date: 09/11/2021   PT End of Session - 09/11/21 1525     Visit Number 21    Number of Visits 30    Date for PT Re-Evaluation 09/22/21   extended   Authorization Type Friday health plan - Aut required after visit 76    Authorization - Visit Number --   same as visit number   Authorization - Number of Visits 30    PT Start Time 0330    PT Stop Time 0410    PT Time Calculation (min) 40 min    Activity Tolerance Patient tolerated treatment well    Behavior During Therapy [facility] for tasks assessed/performed             Past Medical History:  Diagnosis Date   Diabetes mellitus without complication ([place])    Hypertension    Hypothyroidism    Osteoarthritis     History reviewed. No pertinent surgical history.  There were no vitals filed for this visit.   Subjective Assessment - 09/11/21 1532     Subjective Pt reports that she is doing very well.  She feels much better. Shoulder pain 1/10, her back pain is 2/10.    Patient is accompained by: Interpreter   Ipad   Pertinent History Significant PMH: diabetes, hypertension and hypothyroidism             [facility] Adult PT Treatment/Exercise:   Therapeutic Exercise to strengthen hip and core to reduce low back and bil hip pain/R LE pain.  Pt cued for form and pacing throughout: - nu-step L7 55mwhile taking subjective and planning session with patient - LTR - 20x - S/L ER in R S/L - 3x15 - 3#  - S/L flexion - 3# - 2x10 - S/L abd - 3# - 2x10 - Bridge with shoulder ext - 3x10 - Bridge with pilates ring squeeze - 2x10 - S/L hip abduction - 3x10 ea Add in dead bug - Sit to stand - 3x10 25# - bird dog 2x10 -  D/L from 8'' box - 3x10 - 25#  Floor to stand transfers   NOT TODAY - bil shoulder ER with scapular retraction RTB - 3x10 - Prone row - 2x10 5# - Prone ext - 2x10 3# - prone T - 2x15 - corner stretch 465' x2 - X arm stretch 45'' x2 - isometric abdominal squeeze - 10'' 5x     PT Long Term Goals - 08/23/21 1556       PT LONG TERM GOAL #1   Title ALL LTG TARGET DATE: 12/31    Status New      PT LONG TERM GOAL #2   Title Nina GTiera Mensingerwill be able to pick up 45# from floor to simulate lifting her 131year old grandson, not limited by pain    EVAL: limited    Baseline 10/4: unable, but improving 12/1: not attempted, will integrate into therex    Status On-going      PT LONG TERM GOAL #3   Title Nina Patterson be able to navigate 10 steps using reciprocal pattern, not limited by pain, to enable community ambulation    EVAL: limited by pain  Baseline 10/4: MET    Status Achieved      PT LONG TERM GOAL #4   Title Nina Patterson will improve FOTO score from 48 (on evaluation) to 61 as a proxy for functional improvement    Baseline 10/4: 54 12/1: 33    Status On-going      PT LONG TERM GOAL #5   Title Nina Patterson will improve 30'' STS (MCID 2) to >/= 10x to show improved LE strength and improved transfers    EVAL: 8x    Baseline 10/4: 9x 12/1: 14 w/ UE support    Status On-going      PT LONG TERM GOAL #6   Title Nina Patterson will improve the following MMTs to >/= 4/5 to show improvement in strength:  L shoulder ER    EVAL: 3/5 with pain    Baseline 12/1: MET 4/5    Status Achieved      PT LONG TERM GOAL #7   Title Nina Patterson will demonstrate >135 degrees of active ROM in flexion to allow completion of activities involving reaching [place], not limited by pain    EVAL: 120 degrees with pain    Baseline 12/1: MET 140 degrees    Status Achieved      PT LONG TERM GOAL #8   Title Nina Patterson will report >/= 50% decrease in pain from evaluation    EVAL: 8/10 max shoulder pain    Status On-going                   Plan - 09/11/21 1614     Clinical Impression Statement Nina Patterson is progressing well with therapy.  Pt reports no increase in baseline pain following therapy.  Today we concentrated on core strengthening, rotator cuff strengthening, and periscapular strengthening.  Pt continues to show progress with D/L and sit to stand with increased load today with no increase in pain.  She reports she is still uncomfortable with floor to standing transfers and we may incorporate this in the next few visits.  Pt will continue to benefit from skilled physical therapy to address remaining deficits and achieve listed goals.  Continue per POC.    Personal Factors and Comorbidities Comorbidity 3+    Comorbidities Significant PMH: diabetes, hypertension and hypothyroidism    Stability/Clinical Decision Making Stable/Uncomplicated    Rehab Potential Good    PT Frequency 2x / week    PT Duration 8 weeks    PT Treatment/Interventions ADLs/Self Care Home Management;Iontophoresis 93m/ml Dexamethasone;Therapeutic activities;Therapeutic exercise;Neuromuscular re-education;Manual techniques;Dry needling;Vasopneumatic Device;Joint Manipulations;Spinal Manipulations;Gait training   ADLs/Self Care Home Management;Aquatic Therapy;Therapeutic activities;Therapeutic exercise;Neuromuscular re-education;Manual techniques;Iontophoresis 460mml Dexamethasone;Dry needling;Gait training; Traction;Spinal Manipulations;Joint Manipulations   PT Next Visit Plan Get FOTO and create goal, complete 3060 sit to stand and create goal (or other functional strength goal), PPT progression, core strength/hip strength progression, D/L progression?    PT Home Exercise Plan DVAndrewnd Agree with Plan of Care Patient             Patient will benefit from skilled therapeutic intervention in order to  improve the following deficits and impairments:  Pain, Decreased strength  Visit Diagnosis: Chronic left shoulder pain  Chronic right-sided low back pain without sciatica  Muscle weakness  Abnormal posture     Problem List Patient Active Problem List   Diagnosis Date Noted   Primary hypertension 1103/50/0938 Metabolic syndrome 1118/29/9371  Type 2 diabetes mellitus without complication, without long-term current use of insulin ([place]) 08/07/2021   Class 2 severe obesity due to excess calories with serious comorbidity and body mass index (BMI) of 39.0 to 39.9 in adult [facility]) 08/07/2021   Other spondylosis with radiculopathy, cervical region 07/31/2021   Facet degeneration of lumbar region 04/18/2021    Mathis Dad, PT 09/11/2021, 4:15 PM  [place], [place], [address] Phone: [phone]   Fax:  [phone]  Name: Nina Patterson MRN: [id number] Date of Birth: Jun 03, 1951

## 2021-09-13 ENCOUNTER — Ambulatory Visit: Payer: 59 | Admitting: Physical Therapy

## 2021-09-13 ENCOUNTER — Encounter: Payer: Self-pay | Admitting: Physical Therapy

## 2021-09-13 ENCOUNTER — Other Ambulatory Visit: Payer: Self-pay

## 2021-09-13 DIAGNOSIS — M6281 Muscle weakness (generalized): Secondary | ICD-10-CM

## 2021-09-13 DIAGNOSIS — G8929 Other chronic pain: Secondary | ICD-10-CM

## 2021-09-13 DIAGNOSIS — R293 Abnormal posture: Secondary | ICD-10-CM

## 2021-09-13 DIAGNOSIS — M25512 Pain in left shoulder: Secondary | ICD-10-CM | POA: Diagnosis not present

## 2021-09-13 NOTE — Therapy (Signed)
[place], [place], [address] Phone: [phone]   Fax:  [phone]  Physical Therapy Treatment  Patient Details  Name: Nina Patterson MRN: [id number] Date of Birth: 08-04-1951 Referring Provider (PT): Marybelle Killings, MD   Encounter Date: 09/13/2021   PT End of Session - 09/13/21 1525     Visit Number 22    Number of Visits 30    Date for PT Re-Evaluation 09/22/21   extended   Authorization Type Friday health plan - Aut required after visit 66    Authorization - Visit Number --   same as visit number   Authorization - Number of Visits 30    PT Start Time 0330    PT Stop Time 0412    PT Time Calculation (min) 42 min    Activity Tolerance Patient tolerated treatment well    Behavior During Therapy [facility] for tasks assessed/performed             Past Medical History:  Diagnosis Date   Diabetes mellitus without complication ([place])    Hypertension    Hypothyroidism    Osteoarthritis     History reviewed. No pertinent surgical history.  There were no vitals filed for this visit.   Subjective Assessment - 09/13/21 1533     Subjective Pt feels that she continues to improve.  She feels good today despite the rainy weather. Shoulder pain 1/10, her back pain is 1/10.    Patient is accompained by: Interpreter   in person   Pertinent History Significant PMH: diabetes, hypertension and hypothyroidism              [facility] Adult PT Treatment/Exercise:   Therapeutic Exercise to strengthen hip and core to reduce low back and bil hip pain/R LE pain.  Pt cued for form and pacing throughout: - nu-step L8 29mwhile taking subjective and planning session with patient - LTR - 20x - S/L ER in R S/L - 3x15 - 3#  - S/L flexion - 3# - 2x10 - S/L abd - 3# - 2x10 - Bridge with pilates ring squeeze - 2x10 - S/L hip abduction - 3x10 ea - table top position 5'' x10 - Sit to stand - 3x10 25# (n/t) -  bird dog 2x10 - D/L from 6'' box - 3x10 - 25#   Floor to stand transfers   NOT TODAY - bil shoulder ER with scapular retraction RTB - 3x10 - Prone row - 2x10 5# - Prone ext - 2x10 3# - prone T - 2x15 - corner stretch 439' x2 - X arm stretch 45'' x2 - isometric abdominal squeeze - 10'' 5x     PT Long Term Goals - 08/23/21 1556       PT LONG TERM GOAL #1   Title ALL LTG TARGET DATE: 12/31    Status New      PT LONG TERM GOAL #2   Title Nina Patterson be able to pick up 45# from floor to simulate lifting her 136year old grandson, not limited by pain    EVAL: limited    Baseline 10/4: unable, but improving 12/1: not attempted, will integrate into therex    Status On-going      PT LONG TERM GOAL #3   Title Nina Patterson be able to navigate 10 steps using reciprocal pattern, not limited by pain, to enable community ambulation    EVAL: limited by pain  Baseline 10/4: MET    Status Achieved      PT LONG TERM GOAL #4   Title Nina Patterson will improve FOTO score from 48 (on evaluation) to 61 as a proxy for functional improvement    Baseline 10/4: 54 12/1: 35    Status On-going      PT LONG TERM GOAL #5   Title Nina Patterson will improve 30'' STS (MCID 2) to >/= 10x to show improved LE strength and improved transfers    EVAL: 8x    Baseline 10/4: 9x 12/1: 14 w/ UE support    Status On-going      PT LONG TERM GOAL #6   Title Nina Patterson will improve the following MMTs to >/= 4/5 to show improvement in strength:  L shoulder ER    EVAL: 3/5 with pain    Baseline 12/1: MET 4/5    Status Achieved      PT LONG TERM GOAL #7   Title Nina Patterson will demonstrate >135 degrees of active ROM in flexion to allow completion of activities involving reaching Nina Patterson, not limited by pain    EVAL: 120 degrees with pain    Baseline 12/1: MET 140 degrees    Status Achieved      PT LONG TERM GOAL #8   Title  Nina Patterson will report >/= 50% decrease in pain from evaluation    EVAL: 8/10 max shoulder pain    Status On-going                   Plan - 09/13/21 1537     Clinical Impression Statement Nina Patterson is progressing very well with therapy.  Pt reports no increase in baseline pain following therapy.  Today we concentrated on core strengthening, hip strengthening, and functional strengthening.  Pt continues to progress core strength and functional lifting capacity.  She seems to be doing much better.  We will discuss D/C plans in 2 visits.  Pt will continue to benefit from skilled physical therapy to address remaining deficits and achieve listed goals.  Continue per POC.    Personal Factors and Comorbidities Comorbidity 3+    Comorbidities Significant PMH: diabetes, hypertension and hypothyroidism    Stability/Clinical Decision Making Stable/Uncomplicated    Rehab Potential Good    PT Frequency 2x / week    PT Duration 8 weeks    PT Treatment/Interventions ADLs/Self Care Home Management;Iontophoresis 47m/ml Dexamethasone;Therapeutic activities;Therapeutic exercise;Neuromuscular re-education;Manual techniques;Dry needling;Vasopneumatic Device;Joint Manipulations;Spinal Manipulations;Gait training   ADLs/Self Care Home Management;Aquatic Therapy;Therapeutic activities;Therapeutic exercise;Neuromuscular re-education;Manual techniques;Iontophoresis 442mml Dexamethasone;Dry needling;Gait training; Traction;Spinal Manipulations;Joint Manipulations   PT Next Visit Plan Get FOTO and create goal, complete 3055 sit to stand and create goal (or other functional strength goal), PPT progression, core strength/hip strength progression, D/L progression?    PT Home Exercise Plan [place] Agree with Plan of Care Patient             Patient will benefit from skilled therapeutic intervention in order to improve the following deficits and impairments:  Pain, Decreased  strength  Visit Diagnosis: Chronic left shoulder pain  Chronic right-sided low back pain without sciatica  Muscle weakness  Abnormal posture     Problem List Patient Active Problem List   Diagnosis Date Noted   Primary hypertension 1128/41/3244 Metabolic syndrome 1101/10/7251 Type 2 diabetes mellitus without complication, without long-term [place] use of insulin [place]  Class 2 severe obesity due to excess calories with serious comorbidity and body mass index (BMI) of 39.0 to 39.9 in adult [facility]) 08/07/2021   Other spondylosis with radiculopathy, cervical region 07/31/2021   Facet degeneration of lumbar region 04/18/2021    Nina Patterson, PT 09/13/2021, [place], [place], [address] Phone: [phone]   Fax:  [phone]  Name:  Nina Patterson MRN: [id number] Date of Birth: 14-Mar-1951

## 2021-09-20 ENCOUNTER — Encounter: Payer: 59 | Admitting: Physical Therapy

## 2021-09-25 ENCOUNTER — Encounter: Payer: Self-pay | Admitting: Physical Therapy

## 2021-09-25 ENCOUNTER — Other Ambulatory Visit: Payer: Self-pay

## 2021-09-25 ENCOUNTER — Ambulatory Visit: Payer: 59 | Attending: Orthopaedic Surgery | Admitting: Physical Therapy

## 2021-09-25 DIAGNOSIS — R293 Abnormal posture: Secondary | ICD-10-CM | POA: Insufficient documentation

## 2021-09-25 DIAGNOSIS — M545 Low back pain, unspecified: Secondary | ICD-10-CM | POA: Diagnosis present

## 2021-09-25 DIAGNOSIS — M25512 Pain in left shoulder: Secondary | ICD-10-CM | POA: Diagnosis present

## 2021-09-25 DIAGNOSIS — G8929 Other chronic pain: Secondary | ICD-10-CM | POA: Insufficient documentation

## 2021-09-25 DIAGNOSIS — M6281 Muscle weakness (generalized): Secondary | ICD-10-CM | POA: Diagnosis present

## 2021-09-25 NOTE — Addendum Note (Signed)
Addended by: Fredderick Phenix on: 09/25/2021 06:09 PM   Modules accepted: Orders

## 2021-09-25 NOTE — Therapy (Signed)
[place], [place], [address] Phone: [phone]   Fax:  [phone]  PHYSICAL THERAPY DISCHARGE SUMMARY  Visits from Start of Care: 23  Current functional level related to goals / functional outcomes: See assessment/goals   Remaining deficits: See assessment/goals   Education / Equipment: HEP and D/C plans  Patient agrees to discharge. Patient goals were met. Patient is being discharged due to meeting the stated rehab goals.  Patient Details  Name: Nina Patterson MRN: [id number] Date of Birth: 11-23-1950 Referring Provider (PT): Marybelle Killings, MD   Encounter Date: 09/25/2021   PT End of Session - 09/25/21 1527     Visit Number 23    Number of Visits 30    Date for PT Re-Evaluation 09/22/21   extended   Authorization Type [employer] - Aut required after visit 66    Authorization - Visit Number --   same as visit number   Authorization - Number of Visits 30    PT Start Time 3267    PT Stop Time 1610    PT Time Calculation (min) 40 min    Activity Tolerance Patient tolerated treatment well    Behavior During Therapy [facility] for tasks assessed/performed             Past Medical History:  Diagnosis Date   Diabetes mellitus without complication ([place])    Hypertension    Hypothyroidism    Osteoarthritis     History reviewed. No pertinent surgical history.  There were no vitals filed for this visit.   Subjective Assessment - 09/25/21 1534     Subjective Pt reports that she is doing well.  She feels she is ready for D/C. Shoulder pain 1/10, her back pain is 1/10.    Patient is accompained by: Interpreter   in person   Pertinent History Significant PMH: diabetes, hypertension and hypothyroidism            Objective  FOTO: 67 pts   [facility] Adult PT Treatment/Exercise:   Therapeutic Exercise to strengthen hip and core to reduce low back and bil hip pain/R LE pain.   Pt cued for form and pacing throughout: - nu-step L8 81mwhile taking subjective and planning session with patient - reviewing HEP - reviewing CDC guidelines for exercise and strengthening   Therapeutic Activity - collecting information for goals, checking progress, and reviewing with patient;       PT Long Term Goals - 09/25/21 1535       PT LONG TERM GOAL #1   Title ALL LTG TARGET DATE: 12/31    Status New      PT LONG TERM GOAL #2   Title Nina GMercie Balsleywill be able to pick up 45# from floor to simulate lifting her 174year old grandson, not limited by pain    EVAL: limited    Baseline 10/4: unable, but improving 12/1: not attempted, will integrate into therex 09/25/21: MET    Status Achieved      PT LONG TERM GOAL #3   Title Nina GAlexah Kivettwill be able to navigate 10 steps using reciprocal pattern, not limited by pain, to enable community ambulation    EVAL: limited by pain    Baseline 10/4: MET    Status Achieved      PT LONG TERM GOAL #4   Title SHatillowill improve FOTO score from 48 (on evaluation) to 61  as a proxy for functional improvement    Baseline 10/4: 54 12/1: 54 09/25/21: 69    Status Achieved      PT LONG TERM GOAL #5   Title Nina Patterson will improve 30'' STS (MCID 2) to >/= 10x to show improved LE strength and improved transfers    EVAL: 8x    Baseline 10/4: 9x 12/1: 14 w/ UE support    Status Achieved      PT LONG TERM GOAL #6   Title Nina Patterson will improve the following MMTs to >/= 4/5 to show improvement in strength:  L shoulder ER    EVAL: 3/5 with pain    Baseline 12/1: MET 4/5    Status Achieved      PT LONG TERM GOAL #7   Title Nina Patterson will demonstrate >135 degrees of active ROM in flexion to allow completion of activities involving reaching [place], not limited by pain    EVAL: 120 degrees with pain    Baseline 12/1: MET 140 degrees    Status Achieved      PT  LONG TERM GOAL #8   Title Nina Patterson will report >/= 50% decrease in pain from evaluation    EVAL: 8/10 max shoulder pain    Status Achieved                   Plan - 09/25/21 1558     Clinical Impression Statement Nina Patterson has progressed well with therapy.  Improved impairments include: shoulder and lumbar pain, shoulder and lumbar ROM, shoulder/hip/core strength.  Functional improvements include: ability to lift her grandson, reach [place], and complete ADLs with minimal pain.  Progressions needed include: continued work at home via [employer].  Barriers to progress include: none.  Please see baseline and/or status section in "Goals" for specific progress on short term and long term goals established at evaluation.  I recommend D/C home with HEP; pt agrees with plan.    Personal Factors and Comorbidities Comorbidity 3+    Comorbidities Significant PMH: diabetes, hypertension and hypothyroidism    Stability/Clinical Decision Making Stable/Uncomplicated    Rehab Potential Good    PT Frequency 2x / week    PT Duration 8 weeks    PT Treatment/Interventions ADLs/Self Care Home Management;Iontophoresis 66m/ml Dexamethasone;Therapeutic activities;Therapeutic exercise;Neuromuscular re-education;Manual techniques;Dry needling;Vasopneumatic Device;Joint Manipulations;Spinal Manipulations;Gait training   ADLs/Self Care Home Management;Aquatic Therapy;Therapeutic activities;Therapeutic exercise;Neuromuscular re-education;Manual techniques;Iontophoresis 439mml Dexamethasone;Dry needling;Gait training; Traction;Spinal Manipulations;Joint Manipulations   PT Next Visit Plan Get FOTO and create goal, complete 3010 sit to stand and create goal (or other functional strength goal), PPT progression, core strength/hip strength progression, D/L progression?    PT Home Exercise Plan DVHooksnd Agree with Plan of Care Patient             Patient will benefit from  skilled therapeutic intervention in order to improve the following deficits and impairments:  Pain, Decreased strength  Visit Diagnosis: Chronic left shoulder pain  Chronic right-sided low back pain without sciatica  Muscle weakness  Abnormal posture     Problem List Patient Active Problem List   Diagnosis Date Noted   Primary hypertension 1142/35/3614 Metabolic syndrome 1143/15/4008 Type 2 diabetes mellitus without complication, without long-term current use of insulin [place]   Class 2 severe obesity due to excess calories with serious comorbidity and body mass index (BMI) of 39.0 to 39.9 in  adult [facility]) 08/07/2021   Other spondylosis with radiculopathy, cervical region 07/31/2021   Facet degeneration of lumbar region 04/18/2021    Mathis Dad, PT 09/25/2021, 4:17 PM  [facility] [address] [place], [place], [address] Phone: [phone]   Fax:  [phone]  Name: Nina Patterson MRN: [id number] Date of Birth: 1951/01/04

## 2021-10-30 ENCOUNTER — Encounter: Payer: Self-pay | Admitting: Orthopaedic Surgery

## 2021-10-30 ENCOUNTER — Ambulatory Visit (INDEPENDENT_AMBULATORY_CARE_PROVIDER_SITE_OTHER): Payer: 59 | Admitting: Orthopaedic Surgery

## 2021-10-30 ENCOUNTER — Other Ambulatory Visit: Payer: Self-pay

## 2021-10-30 VITALS — BP 112/71 | HR 84 | Ht 61.0 in | Wt 208.0 lb

## 2021-10-30 DIAGNOSIS — M4722 Other spondylosis with radiculopathy, cervical region: Secondary | ICD-10-CM | POA: Diagnosis not present

## 2021-10-30 DIAGNOSIS — M47816 Spondylosis without myelopathy or radiculopathy, lumbar region: Secondary | ICD-10-CM | POA: Diagnosis not present

## 2021-10-30 NOTE — Progress Notes (Signed)
Office Visit Note   Patient: Nina Patterson           Date of Birth: [birth year]           MRN: [id number] Visit Date: 10/30/2021              Requested by: Joycelyn Man, [place],  [place] [address] PCP: Joycelyn Man, FNP   Assessment & Plan: Visit Diagnoses:  1. Other spondylosis with radiculopathy, cervical region   2. Facet degeneration of lumbar region     Plan: Patient is happy with the results of therapy and can return on an as-needed basis.  Follow-Up Instructions: Return if symptoms worsen or fail to improve.   Orders:  No orders of the defined types were placed in this encounter.  No orders of the defined types were placed in this encounter.     Procedures: No procedures performed   Clinical Data: No additional findings.   Subjective: Chief Complaint  Patient presents with   Neck - Follow-up   Lower Back - Follow-up    HPI 71 year old female returns for follow-up of neck and back pain states therapy has been great and her pain is gone with the complete relief.  She is active and is continuing home exercises.  Review of Systems updated unchanged of noticed type 2 diabetes, hypertension.   Objective: Vital Signs: BP 112/71    Pulse 84    Ht 5\' 1"  (1.549 m)    Wt 208 lb (94.3 kg)    BMI 39.30 kg/m   Physical Exam Constitutional:      Appearance: She is well-developed.  HENT:     Head: Normocephalic.     Right Ear: External ear normal.     Left Ear: External ear normal. There is no impacted cerumen.  Eyes:     Pupils: Pupils are equal, round, and reactive to light.  Neck:     Thyroid: No thyromegaly.     Trachea: No tracheal deviation.  Cardiovascular:     Rate and Rhythm: Normal rate.  Pulmonary:     Effort: Pulmonary effort is normal.  Abdominal:     Palpations: Abdomen is soft.  Musculoskeletal:     Cervical back: No rigidity.  Skin:    General: Skin is warm and dry.  Neurological:     Mental  Status: She is alert and oriented to person, place, and time.  Psychiatric:        Behavior: Behavior normal.    Ortho Exam patient gets from sitting standing rapidly can bend turn and twist without pain normal heel toe gait.  No sensory deficit.  Specialty Comments:  No specialty comments available.  Imaging: No results found.   PMFS History: Patient Active Problem List   Diagnosis Date Noted   Primary hypertension [id number]   Metabolic syndrome A999333   Type 2 diabetes mellitus without complication, without long-term current use of insulin ([place]) 08/07/2021   Class 2 severe obesity due to excess calories with serious comorbidity and body mass index (BMI) of 39.0 to 39.9 in adult [facility]) 08/07/2021   Other spondylosis with radiculopathy, cervical region 07/31/2021   Facet degeneration of lumbar region 04/18/2021   Past Medical History:  Diagnosis Date   Diabetes mellitus without complication ([place])    Hypertension    Hypothyroidism    Osteoarthritis     No family history on file.  No past surgical history on file. Social History  Occupational History   Not on file  Tobacco Use   Smoking status: Never   Smokeless tobacco: Never  Substance and Sexual Activity   Alcohol use: Not Currently   Drug use: Not on file   Sexual activity: Not on file

## 2021-10-31 ENCOUNTER — Ambulatory Visit: Payer: 59 | Admitting: Orthopaedic Surgery

## 2021-11-07 ENCOUNTER — Telehealth: Payer: Self-pay | Admitting: Pharmacist Clinician (PhC)/ Clinical Pharmacy Specialist

## 2021-11-07 NOTE — Telephone Encounter (Signed)
Pt is needing a refill on her ozempic .Marland Kitchen please advise

## 2021-11-08 ENCOUNTER — Other Ambulatory Visit: Payer: Self-pay | Admitting: Pharmacist Clinician (PhC)/ Clinical Pharmacy Specialist

## 2021-11-08 MED ORDER — OZEMPIC (0.25 OR 0.5 MG/DOSE) 2 MG/1.5ML ~~LOC~~ SOPN: 0.5000 mg | PEN_INJECTOR | SUBCUTANEOUS | 0 refills | Status: DC

## 2021-11-26 ENCOUNTER — Telehealth: Payer: Self-pay

## 2021-11-26 NOTE — Telephone Encounter (Signed)
Called the pt's daughter w/intrepreter [phone]) and the stated that they were and we scheduled an appointment for 4/5 at 10am for the [facility] pharmacist. The pt's daughter voiced understanding.  ?

## 2021-11-26 NOTE — Telephone Encounter (Signed)
-----   Message from Rosalee Kaufman, RPH-CPP sent at 11/22/2021  4:09 PM EST ----- ?Please see if still on Ozempic and if so,  she will need f/u in April at [facility] with me ? ?Thank you! ?----- Message ----- ?From: Rosalee Kaufman, RPH-CPP ?Sent: 11/12/2021  12:00 AM EST ?To: Rosalee Kaufman, RPH-CPP ? ?Call to schedule [facility] ozempic f/u for March/April ? ? ?

## 2021-12-26 ENCOUNTER — Encounter (HOSPITAL_BASED_OUTPATIENT_CLINIC_OR_DEPARTMENT_OTHER): Payer: Self-pay | Admitting: Pharmacist Clinician (PhC)/ Clinical Pharmacy Specialist

## 2021-12-26 ENCOUNTER — Ambulatory Visit (HOSPITAL_BASED_OUTPATIENT_CLINIC_OR_DEPARTMENT_OTHER): Payer: 59

## 2021-12-26 ENCOUNTER — Ambulatory Visit (HOSPITAL_BASED_OUTPATIENT_CLINIC_OR_DEPARTMENT_OTHER): Payer: 59 | Admitting: Pharmacist Clinician (PhC)/ Clinical Pharmacy Specialist

## 2021-12-26 DIAGNOSIS — Z6839 Body mass index (BMI) 39.0-39.9, adult: Secondary | ICD-10-CM

## 2021-12-26 NOTE — Assessment & Plan Note (Signed)
Patient saw excellent results with Ozempic, unfortunately had allergic reaction and could not continue after 5 weeks.  She has made good choices with adjusting her diet and will continue to work on that as a means of weight loss.  Advised she should continue with yearly follow up with cardiologist.   ?

## 2021-12-26 NOTE — Patient Instructions (Signed)
Discontinue use of Ozempic.   ? ?Continue to work on dietary changes and exercise. ? ?You lost just over 17 pounds from our last visit ?

## 2021-12-26 NOTE — Progress Notes (Signed)
Patient ID: Nina Patterson                 DOB: Feb 10, 1951                    MRN: [id number] ? ? ? ? ?HPI: ?Nina Patterson is a 71 y.o. female patient referred to pharmacy clinic by Dr. Cristal Deer to initiate weight loss therapy with GLP1-RA. PMH is significant for obesity complicated by chronic medical conditions including diabetes, hyperlipidemia and hypertension. Most recent BMI 39.4, previous BMI 40.2. ? ?Today she returns for a follow up appointment.  Her appointment had been cancelled erroneously by the system, so there is no translator here.  We used a translation app on her phone in addition to her broken [place] and my broken [place].  She has lost 18 pounds (8.5%) in the 2 months since she was last here, despite having stopped Ozempic after 5 doses.  She did well with the 0.25 mg dose, but when increased to 0.5 mg developed a rash on torso and limbs (but not face or hands), and also had problems with her voice going out.  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  She has been working on modifying her diet, drinks a lemon/ginger concoction twice daily (about 1 tablespoon per dose) to help keep hunger at bay.  ? ?Current weight management medications: none ? ?Previously tried meds:  alli ? ?Current meds that may affect weight: none ? ?Baseline weight/BMI: 40.2 ?BMI today: 36.01 ? ?Insurance payor:  [employer] [place] ? ?Diet: eats mostly home cooked meals, avoids fats, grease makes her nauseated   ?Breakfast - rotates between cheese tortilla or unsweetened cereal. ?Lunch/dinner are more chicken and rice, native [place] foods, gave up breads, eating more fruits ? ?Exercise: PT for arthritis in the hands; treadmill 30 min/day, stretches daily ? ?Family History: maternal grandfather and aunt were both diabetic, 1 sister deceased from cancer, patient repeatedly  confirmed was neck but NOT thyroid, she also had Trudie Buckler syndrome.  Had 2 children, both healthy ? ?Social History: quit smoking 30 years, only occasional alcohol, coffee daily ? ?Labs: ?No results found for: HGBA1C ? ?Wt Readings from Last 1 Encounters:  ?10/30/21 208 lb (94.3 kg)  ? ? ?BP Readings from Last 1 Encounters:  ?10/30/21 112/71  ? ?Pulse Readings from Last 1 Encounters:  ?10/30/21 84  ? ? ?   ?Component Value Date/Time  ? CHOL 103 07/23/2021 1518  ? TRIG 271.0 (H) 07/23/2021 1518  ? HDL 36.70 (L) 07/23/2021 1518  ? CHOLHDL 3 07/23/2021 1518  ? VLDL 54.2 (H) 07/23/2021 1518  ? LDLDIRECT 45.0 07/23/2021 1518  ? ? ?Past Medical History:  ?Diagnosis Date  ? Diabetes mellitus without complication (HCC)   ? Hypertension   ? Hypothyroidism   ? Osteoarthritis   ? ? ?Current Outpatient Medications on File Prior to Visit  ?Medication Sig Dispense Refill  ? acetaminophen (TYLENOL) 650 MG CR tablet Take 650 mg by mouth every 8 (eight) hours as needed for pain.    ? atorvastatin (LIPITOR) 20 MG tablet Take 20 mg by mouth at bedtime.    ? cholecalciferol (VITAMIN D3) 25 MCG (1000 UNIT) tablet Take 1,000 Units by mouth daily.    ? EQ ASPIRIN ADULT LOW DOSE 81 MG EC tablet Take 81 mg by mouth  daily.    ? EUTHYROX 75 MCG tablet Take 1 tablet (75 mcg total) by mouth daily. 90 tablet 2  ? FOLIC ACID PO Take by mouth.    ? losartan-hydrochlorothiazide (HYZAAR) 50-12.5 MG tablet Take 1 tablet by mouth daily.    ? metFORMIN (GLUCOPHAGE-XR) 500 MG 24 hr tablet Take 1 tablet (500 mg total) by mouth daily. 90 tablet 2  ? Omega-3 Fatty Acids (FISH OIL) 1000 MG CPDR Take 1 tablet by mouth in the morning, at noon, and at bedtime.    ? Semaglutide,0.25 or 0.5MG /DOS, (OZEMPIC, 0.25 OR 0.5 MG/DOSE,) 2 MG/1.5ML SOPN Inject 0.5 mg into the skin once a week. 1.5 mL 0  ? ?No current facility-administered medications on file prior to visit.  ? ? ?No Known Allergies ? ? ?Assessment/Plan: ? ?1. Weight loss - Patient saw excellent  results with Ozempic, unfortunately had allergic reaction and could not continue after 5 weeks.  She has made good choices with adjusting her diet and will continue to work on that as a means of weight loss.  Advised she should continue with yearly follow up with cardiologist. ? ?Phillips Hay PharmD CPP [facility] ?[facility] ?[address] ?[place] [address] ? ? ?

## 2022-01-23 ENCOUNTER — Ambulatory Visit: Payer: 59 | Admitting: Internal Medicine

## 2022-01-23 NOTE — Progress Notes (Deleted)
Name: Nina Patterson  MRN/ DOB: [id number], [birth year]    Age/ Sex: 71 y.o., female    PCP: Nina BeenYaya, Irene J, FNP   Reason for Endocrinology Evaluation: Hypothyroid/left thyroid nodule     Date of Initial Endocrinology Evaluation: 01/23/2022     HPI: [name] is a 71 y.o. female with a past medical history of hypothyroid , T2DM and HTN. The patient presented for initial endocrinology clinic visit on 01/23/2022 for consultative assistance with her hypothyroid/left thyroid nodule.   She was diagnosed with hypothyroidism at age 71 , and has been on LT-4 replacement since then.   Today she brought printed ultrasound images with left thyroid nodule TIRAD 3 , measures  0.9 x0.9 cm on thyroid ultrasound dated 2019.    No local neck symptoms  Denies prior FNA's of the thyroid nodule  Weight fluctuates  NO constipation or diarrhea    She  also has diabetes ( A1c 6.5 % in 01/2021) ,per pt she has been diagnosed with prediabetes and has been on Metformin without side effects.   She does not check glucose at home but  she states she eats healthy and avoids sugar sweetened beverages     SUBJECTIVE:    Today (01/23/22): [name] is here for follow-up on hypothyroidism and diabetes management.   HOME ENDOCRINE MEDICATIONS   Levothyroxine 75 MCG daily Metformin 500 mg 1 tab with dinner.      DIABETIC COMPLICATIONS: Microvascular complications:  CKD III Denies:  neuropathy, retinopathy Last Eye Exam: Completed   Macrovascular complications:   Denies: CAD, CVA, PVD      HISTORY:  Past Medical History:  Past Medical History:  Diagnosis Date   Diabetes mellitus without complication (HCC)    Hypertension    Hypothyroidism    Osteoarthritis    Past Surgical History: No past surgical history on file.  Social History:  reports that she has never smoked. She has never used smokeless tobacco. She reports that she does not currently use  alcohol. Family History: family history is not on file.   HOME MEDICATIONS: Allergies as of 01/23/2022       Reactions   Semaglutide Itching, Rash        Medication List        Accurate as of Jan 23, 2022  7:51 AM. If you have any questions, ask your nurse or doctor.          acetaminophen 650 MG CR tablet Commonly known as: TYLENOL Take 650 mg by mouth every 8 (eight) hours as needed for pain.   atorvastatin 20 MG tablet Commonly known as: LIPITOR Take 20 mg by mouth at bedtime.   cholecalciferol 25 MCG (1000 UNIT) tablet Commonly known as: VITAMIN D3 Take 1,000 Units by mouth daily.   EQ Aspirin Adult Low Dose 81 MG EC tablet Generic drug: aspirin Take 81 mg by mouth daily.   Euthyrox 75 MCG tablet Generic drug: levothyroxine Take 1 tablet (75 mcg total) by mouth daily.   Fish Oil 1000 MG Cpdr Take 1 tablet by mouth in the morning, at noon, and at bedtime.   FOLIC ACID PO Take by mouth.   losartan-hydrochlorothiazide 50-12.5 MG tablet Commonly known as: HYZAAR Take 1 tablet by mouth daily.   metFORMIN 500 MG 24 hr tablet Commonly known as: GLUCOPHAGE-XR Take 1 tablet (500 mg total) by mouth daily.          REVIEW OF SYSTEMS: A comprehensive ROS  was conducted with the patient and is negative except as per HPI and below:  Review of Systems  Gastrointestinal:  Negative for diarrhea, nausea and vomiting.      OBJECTIVE:  VS: There were no vitals taken for this visit.   Wt Readings from Last 3 Encounters:  12/26/21 190 lb 7.6 oz (86.4 kg)  10/30/21 208 lb (94.3 kg)  09/07/21 208 lb 8.9 oz (94.6 kg)     EXAM: General: Pt appears well and is in NAD  Neck: General: Supple without adenopathy. Thyroid: Thyroid size normal.  No goiter or nodules appreciated. No thyroid bruit.  Lungs: Clear with good BS bilat with no rales, rhonchi, or wheezes  Heart: Auscultation: RRR.  Abdomen: Normoactive bowel sounds, soft, nontender, without masses or  organomegaly palpable  Extremities:  BL LE: No pretibial edema normal ROM and strength.  Mental Status: Judgment, insight: Intact Orientation: Oriented to time, place, and person Mood and affect: No depression, anxiety, or agitation     DATA REVIEWED:   Results for Nina Patterson (MRN [id number]) as of 07/25/2021 09:15  Ref. Range 07/23/2021 15:18  Sodium Latest Ref Range: 135 - 145 mEq/L 141  Potassium Latest Ref Range: 3.5 - 5.1 mEq/L 4.4  Chloride Latest Ref Range: 96 - 112 mEq/L 103  CO2 Latest Ref Range: 19 - 32 mEq/L 30  Glucose Latest Ref Range: 70 - 99 mg/dL 546 (H)  BUN Latest Ref Range: 6 - 23 mg/dL 23  Creatinine Latest Ref Range: 0.40 - 1.20 mg/dL 2.70  Calcium Latest Ref Range: 8.4 - 10.5 mg/dL 35.0  GFR Latest Ref Range: >60.00 mL/min 46.16 (L)  Total CHOL/HDL Ratio Unknown 3  Cholesterol Latest Ref Range: 0 - 200 mg/dL 093  HDL Cholesterol Latest Ref Range: >39.00 mg/dL 81.82 (L)  Direct LDL Latest Units: mg/dL 99.3  MICROALB/CREAT RATIO Latest Ref Range: 0.0 - 30.0 mg/g 1.1  NonHDL Unknown 66.65  Triglycerides Latest Ref Range: 0.0 - 149.0 mg/dL 716.9 (H)  VLDL Latest Ref Range: 0.0 - 40.0 mg/dL 67.8 (H)  TSH Latest Ref Range: 0.35 - 5.50 uIU/mL 2.36  Creatinine,U Latest Units: mg/dL 93.8  Microalb, Ur Latest Ref Range: 0.0 - 1.9 mg/dL <1.0    1/75/1025 TSH 8.527 A1c 6.5%  ASSESSMENT/PLAN/RECOMMENDATIONS:   Hypothyroidism   - Pt is clinically euthyroid  -TSh is normal  - NO changes     Medication  Continue Levothyroxine 75 mcg daily   2. Left thyroid nodule :   On  Ultrasound 2019 this was 0.9 cm in diameter No local neck symptoms    3. T2DM , Optimally Controlled , with CKD III  Complications :  - Pt follows a low carb diet  - No side effects to Metformin  - MA/Cr ratio normal   Medications : Continue Metformin 500 mg daily     4. Dyslipidemia :  - LDL at goal, Tg elevated -We will emphasize the importance of  low-fat and low carbohydrate diet  Medication  Continue Atorvastatin 20 mg daily    Metformin 500 mg daily    F/U in 6 months   Signed electronically by: Lyndle Herrlich, MD  [facility] Endocrinology  [facility] [address] [place], [place] [address] Phone: [phone] FAX: [phone]   CC: Nina Been, FNP [address] [place] [address] Phone: [phone] Fax: [phone]   Return to Endocrinology clinic as below: Future Appointments  Date Time Provider Department Center  01/23/2022  2:00 PM Kamyrah Feeser, Konrad Dolores, MD [facility] None

## 2022-12-17 IMAGING — DX DG HIP (WITH OR WITHOUT PELVIS) 2-3V*R*
3 series · 3 of 3 positions shown · non-contrast
Comparison: None.

CLINICAL DATA: Right hip pain status post fall 6 weeks ago

EXAM:
DG HIP (WITH OR WITHOUT PELVIS) 2-3V RIGHT

[pelvis ap]
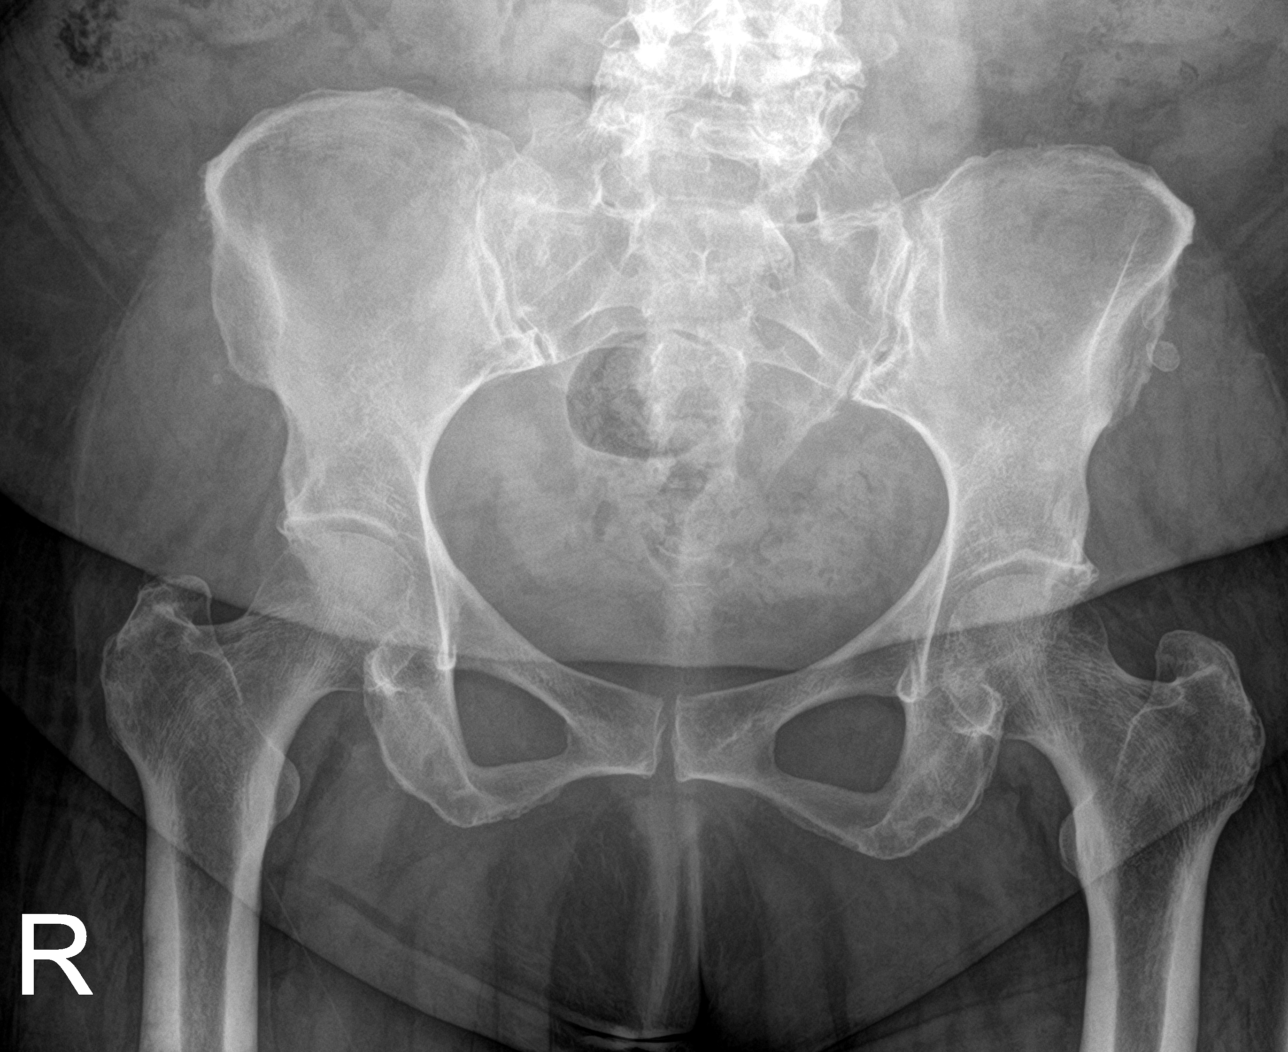

[hip ap]
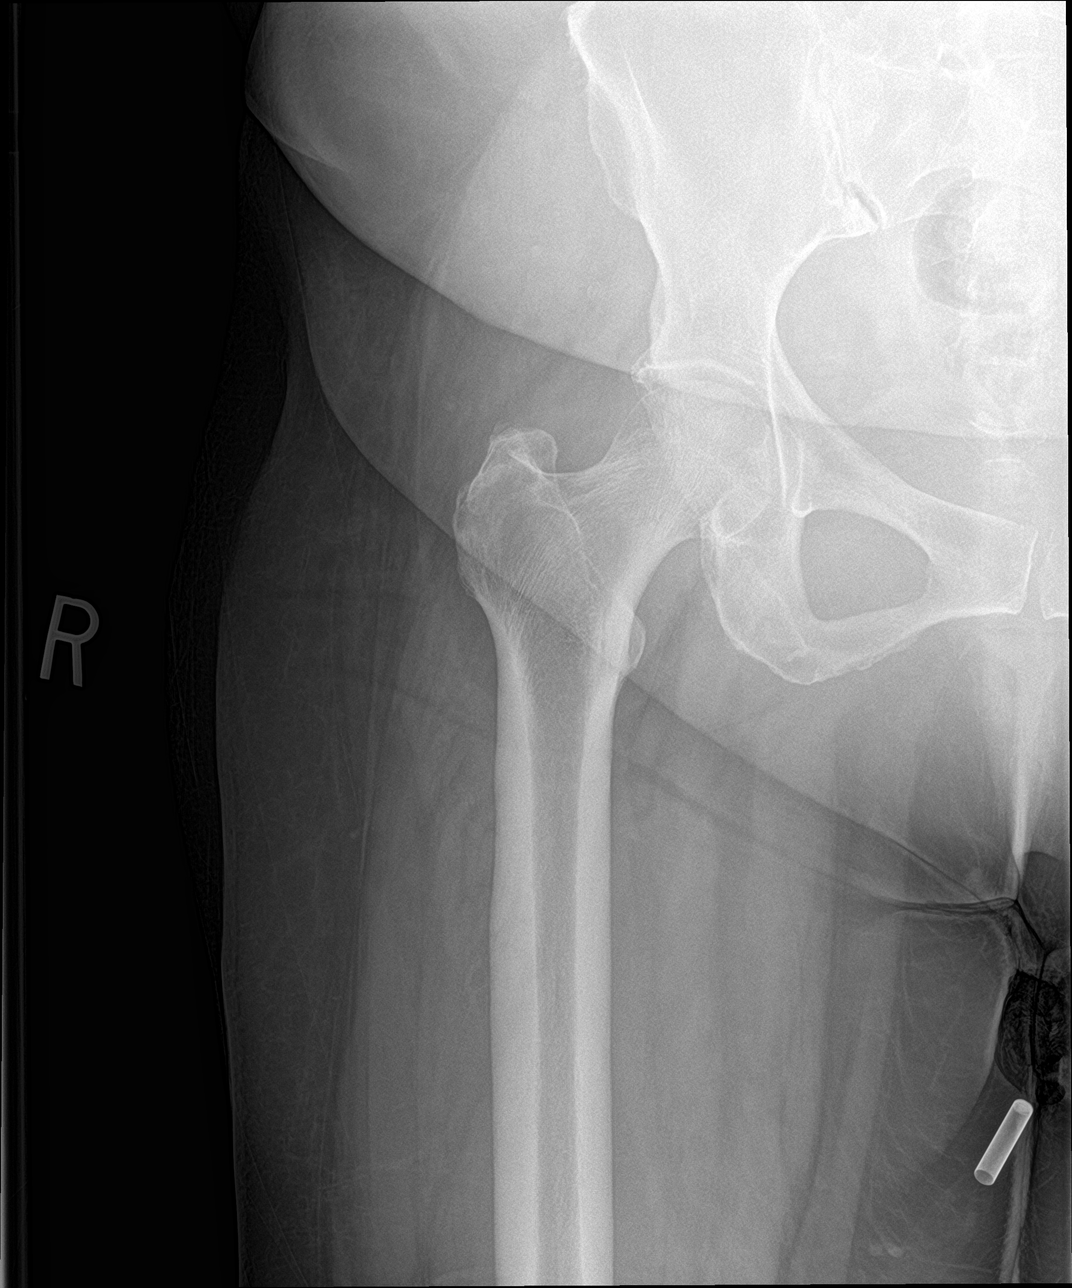

[hip lat]
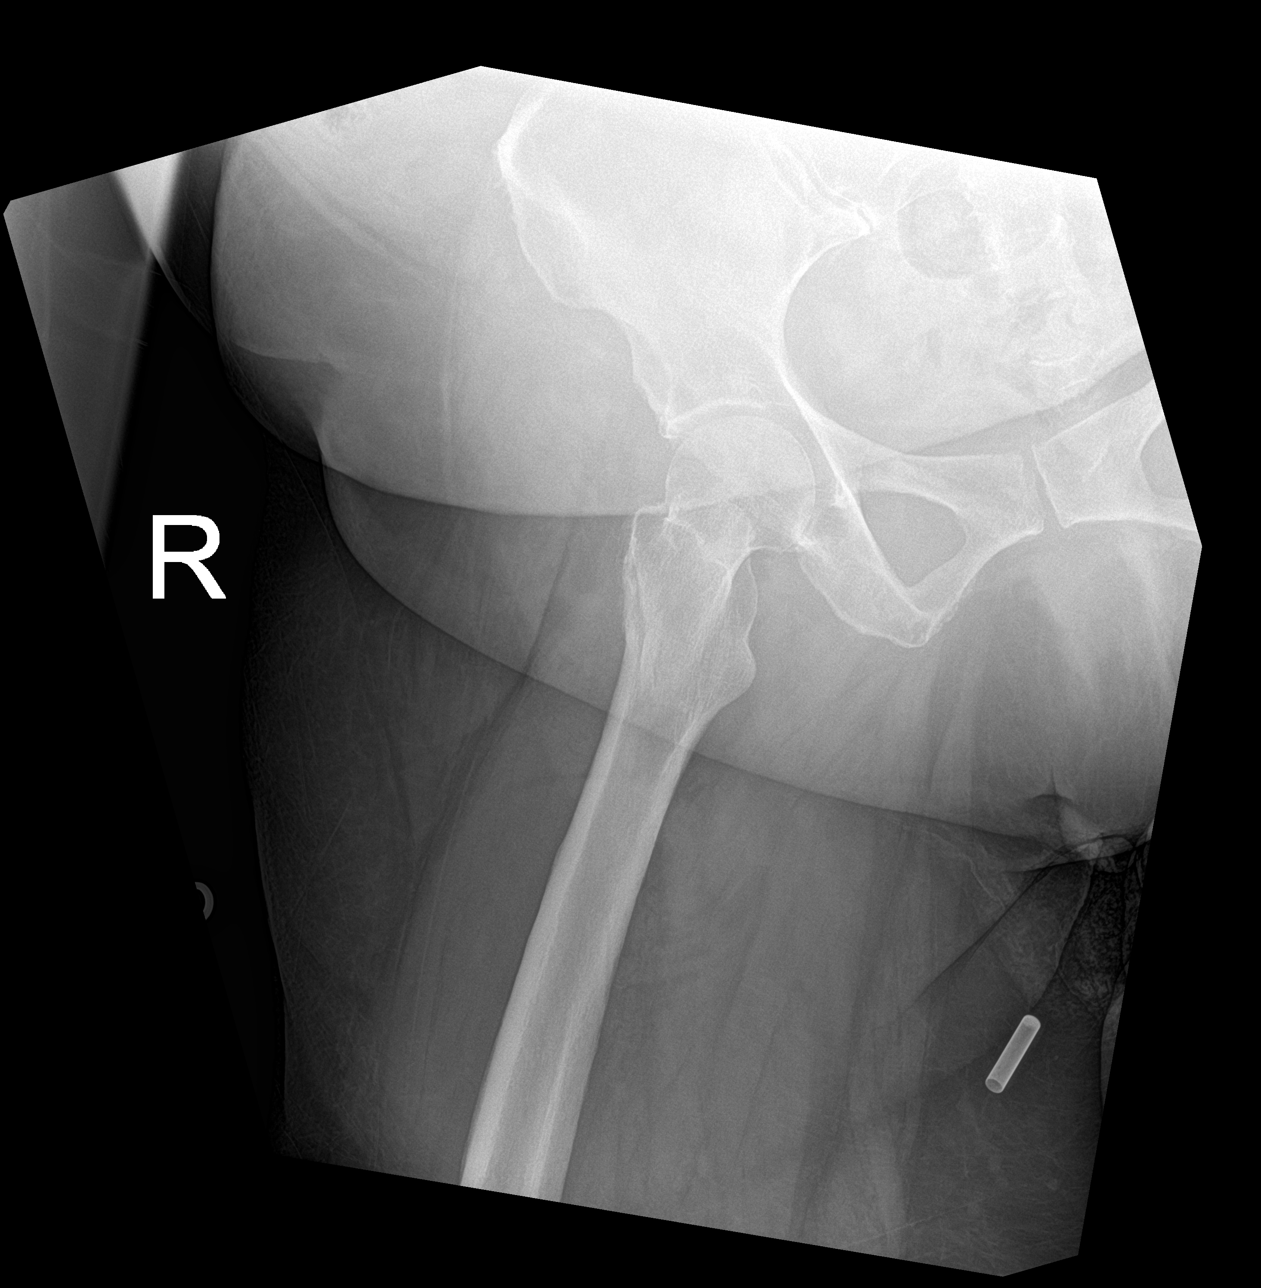

[3 of 3 positions shown; findings below may reference images not displayed]

FINDINGS: No acute fracture or dislocation. No aggressive osseous lesion.
Normal alignment. Generalized osteopenia. Mild osteoarthritis of the
SI joints bilaterally. Lower lumbar spine spondylosis.

Soft tissue are unremarkable. No radiopaque foreign body or soft
tissue emphysema.
IMPRESSION: 1. No acute osseous injury of the right hip.

## 2023-05-14 ENCOUNTER — Encounter: Payer: Self-pay | Admitting: Family

## 2023-05-14 ENCOUNTER — Ambulatory Visit (INDEPENDENT_AMBULATORY_CARE_PROVIDER_SITE_OTHER): Payer: Commercial Managed Care - HMO | Admitting: Family

## 2023-05-14 VITALS — BP 122/73 | HR 76 | Temp 97.5°F | Ht 60.0 in | Wt 211.4 lb

## 2023-05-14 DIAGNOSIS — Z6841 Body Mass Index (BMI) 40.0 and over, adult: Secondary | ICD-10-CM

## 2023-05-14 DIAGNOSIS — I152 Hypertension secondary to endocrine disorders: Secondary | ICD-10-CM

## 2023-05-14 DIAGNOSIS — J069 Acute upper respiratory infection, unspecified: Secondary | ICD-10-CM

## 2023-05-14 DIAGNOSIS — E1159 Type 2 diabetes mellitus with other circulatory complications: Secondary | ICD-10-CM | POA: Diagnosis not present

## 2023-05-14 DIAGNOSIS — E1169 Type 2 diabetes mellitus with other specified complication: Secondary | ICD-10-CM

## 2023-05-14 DIAGNOSIS — E119 Type 2 diabetes mellitus without complications: Secondary | ICD-10-CM

## 2023-05-14 DIAGNOSIS — E785 Hyperlipidemia, unspecified: Secondary | ICD-10-CM

## 2023-05-14 DIAGNOSIS — Z7984 Long term (current) use of oral hypoglycemic drugs: Secondary | ICD-10-CM | POA: Diagnosis not present

## 2023-05-14 DIAGNOSIS — E039 Hypothyroidism, unspecified: Secondary | ICD-10-CM

## 2023-05-14 MED ORDER — LOSARTAN POTASSIUM-HCTZ 50-12.5 MG PO TABS
1.0000 | ORAL_TABLET | Freq: Every day | ORAL | 1 refills | Status: DC
Start: 2023-05-14 — End: 2023-11-18

## 2023-05-14 MED ORDER — METFORMIN HCL ER 500 MG PO TB24: 500.0000 mg | ORAL_TABLET | Freq: Every day | ORAL | 1 refills | Status: DC

## 2023-05-14 MED ORDER — ATORVASTATIN CALCIUM 20 MG PO TABS
20.0000 mg | ORAL_TABLET | Freq: Every day | ORAL | 1 refills | Status: DC
Start: 2023-05-14 — End: 2023-06-15

## 2023-05-14 MED ORDER — LEVOTHYROXINE SODIUM 75 MCG PO TABS
75.0000 ug | ORAL_TABLET | Freq: Every day | ORAL | 1 refills | Status: DC
Start: 2023-05-14 — End: 2023-11-18

## 2023-05-14 NOTE — Progress Notes (Signed)
New Patient Office Visit  Subjective:  Patient ID: Nina Patterson, female    DOB: [birth year]  Age: 72 y.o. MRN: [id number]  CC:  Chief Complaint  Patient presents with   New Patient (Initial Visit)   Hypothyroidism   Hypertension   Prediabetes    Pt states she was prediabetic in the past.     HPI Nina Patterson presents for establishing care today.  Patient presents today for follow up of multiple medical problems.  Hypothyroidism: Patient presents today for followup of Hypothyroidism.  Patient reports positive compliance with daily medication.  Euthrox  Patient denies any of the following symptoms: fatigue, cold intolerance, constipation, weight gain or inability to lose weight, muscle weakness, mental slowing, dry hair and skin. Last TSH and free T4: Lab Results  Component Value Date   TSH 2.36 07/23/2021    Diabetes Type 2 Pt is currently maintained on the following medications for diabetes: Metformin 500mg  daily. Last diabetic eye exam is unknown. Denies polyuria/polydipsia. Denies hypoglycemia. Home glucose readings range: not checking - Last A1C 5.8 02/2023; GFR 46 No results found for: "HGBA1C"  Lab Results  Component Value Date   MICROALBUR <0.7 07/23/2021   CREATININE 1.19 07/23/2021   Hyperlipidemia Patient is currently maintained on the following medication for hyperlipidemia: Lipitor 20mg   Patient denies myalgia. Patient reports good compliance with low fat/low cholesterol diet.  Last lipid panel as follows:  Lab Results  Component Value Date   CHOL 103 07/23/2021   HDL 36.70 (L) 07/23/2021   LDLDIRECT 45.0 07/23/2021   TRIG 271.0 (H) 07/23/2021   CHOLHDL 3 07/23/2021   Hypertension Patient is currently maintained on the following medications for blood pressure: Losartan-HCTZ 50-12.5mg  Patient reports good compliance with blood pressure medications. Patient denies chest pain, shortness of breath, headache, dizziness, or  swelling. Last 3 blood pressure readings in our office are as follows: BP Readings from Last 3 Encounters:  05/14/23 122/73  10/30/21 112/71  08/07/21 128/76   URI sx:  seen in UC and given Augmentin & Tessalon pearles, (pearles worsened leg stiffness & cramps); was only taking abt daily not bid. Feeling better overall.   Assessment & Plan:  Type 2 diabetes mellitus without complication, without long-term current use of insulin (HCC) Assessment & Plan: chronic, stable  taking Metformin 500mg  ER daily. Last A1C 5.8 - 02/2023 sending refill advised to return for  CPE w/labs f/u 1 month  Orders: -     metFORMIN HCl ER; Take 1 tablet (500 mg total) by mouth daily.  Dispense: 90 tablet; Refill: 1  Hypertension associated with type 2 diabetes mellitus (HCC) Assessment & Plan: chronic, stable taking Losartan-HCTZ 50-12.5mg  daily BP in good range sending refill last GFR 02/2023 - 46 advised to return for  CPE w/labs f/u 1 month  Orders: -     Losartan Potassium-HCTZ; Take 1 tablet by mouth daily.  Dispense: 90 tablet; Refill: 1  Hyperlipidemia due to type 2 diabetes mellitus (HCC) Assessment & Plan: chronic taking Lipitor 20mg  daily sending refill advised to return for  CPE w/labs f/u 1 month  Orders: -     Atorvastatin Calcium; Take 1 tablet (20 mg total) by mouth at bedtime.  Dispense: 90 tablet; Refill: 1  Acquired hypothyroidism Assessment & Plan: chronic taking Euthyrox daily sending refill advised to return for  CPE w/labs f/u 1 month  Orders: -     Levothyroxine Sodium; Take 1 tablet (75 mcg total) by mouth daily.  Dispense: 90 tablet; Refill: 1  Morbid obesity (HCC) Assessment & Plan: chronic hx of Ozempic, lost weight, but developed allergic reaction may tolerate different GLP-1, will discuss next visit Wt. Loss strategies reviewed including portion control, less carbs including sweets, eating most of calories earlier in day, drinking 64oz water qd,  and establishing daily exercise routine.  f/u 1 mo   Viral upper respiratory tract infection- advised to continue & finish Augmentin, bid after meals, stop taking the Tessalon pearles, can take OTC meds, including cough syrup & Tylenol for pain, increase water intake. Call back if sx start worsening again.  Subjective:    Outpatient Medications Prior to Visit  Medication Sig Dispense Refill   acetaminophen (TYLENOL) 650 MG CR tablet Take 650 mg by mouth every 8 (eight) hours as needed for pain.     cholecalciferol (VITAMIN D3) 25 MCG (1000 UNIT) tablet Take 1,000 Units by mouth daily.     EQ ASPIRIN ADULT LOW DOSE 81 MG EC tablet Take 81 mg by mouth daily.     FOLIC ACID PO Take by mouth.     Omega-3 Fatty Acids (FISH OIL) 1000 MG CPDR Take 1 tablet by mouth in the morning, at noon, and at bedtime.     atorvastatin (LIPITOR) 20 MG tablet Take 20 mg by mouth at bedtime.     EUTHYROX 75 MCG tablet Take 1 tablet (75 mcg total) by mouth daily. 90 tablet 2   losartan-hydrochlorothiazide (HYZAAR) 50-12.5 MG tablet Take 1 tablet by mouth daily.     metFORMIN (GLUCOPHAGE-XR) 500 MG 24 hr tablet Take 1 tablet (500 mg total) by mouth daily. 90 tablet 2   No facility-administered medications prior to visit.   Past Medical History:  Diagnosis Date   Class 2 severe obesity due to excess calories with serious comorbidity and body mass index (BMI) of 39.0 to 39.9 in adult [facility]) 08/07/2021   Diabetes mellitus without complication (HCC)    Hypertension    Hypothyroidism    Osteoarthritis    Primary hypertension 08/07/2021   No past surgical history on file.  Objective:   Today's Vitals: BP 122/73   Pulse 76   Temp (!) 97.5 F (36.4 C) (Temporal)   Ht 5' (1.524 m)   Wt 211 lb 6.4 oz (95.9 kg)   SpO2 98%   BMI 41.29 kg/m   Physical Exam Vitals and nursing note reviewed.  Constitutional:      Appearance: Normal appearance.  HENT:     Right Ear: Ear canal and external ear normal.      Left Ear: Ear canal and external ear normal.     Nose:     Right Sinus: No frontal sinus tenderness.     Left Sinus: No frontal sinus tenderness.     Mouth/Throat:     Mouth: Mucous membranes are moist.     Pharynx: No pharyngeal swelling, oropharyngeal exudate, posterior oropharyngeal erythema or uvula swelling.  Cardiovascular:     Rate and Rhythm: Normal rate and regular rhythm.  Pulmonary:     Effort: Pulmonary effort is normal.     Breath sounds: Normal breath sounds.  Musculoskeletal:        General: Normal range of motion.  Lymphadenopathy:     Head:     Right side of head: No submandibular, tonsillar, preauricular, posterior auricular or occipital adenopathy.     Left side of head: No submandibular, tonsillar, preauricular, posterior auricular or occipital adenopathy.     Cervical: No  cervical adenopathy.  Skin:    General: Skin is warm and dry.  Neurological:     Mental Status: She is alert.  Psychiatric:        Mood and Affect: Mood normal.        Behavior: Behavior normal.    Meds ordered this encounter  Medications   metFORMIN (GLUCOPHAGE-XR) 500 MG 24 hr tablet    Sig: Take 1 tablet (500 mg total) by mouth daily.    Dispense:  90 tablet    Refill:  1   losartan-hydrochlorothiazide (HYZAAR) 50-12.5 MG tablet    Sig: Take 1 tablet by mouth daily.    Dispense:  90 tablet    Refill:  1   atorvastatin (LIPITOR) 20 MG tablet    Sig: Take 1 tablet (20 mg total) by mouth at bedtime.    Dispense:  90 tablet    Refill:  1   levothyroxine (SYNTHROID) 75 MCG tablet    Sig: Take 1 tablet (75 mcg total) by mouth daily.    Dispense:  90 tablet    Refill:  1    Order Specific Question:   Supervising Provider    Answer:   ANDY, CAMILLE L [[id number]]    Dulce Sellar, NP

## 2023-05-14 NOTE — Patient Instructions (Addendum)
  Bienvenidos a Insurance claims handler en [place]. Fue un Scientist, forensic.  Como ya lo comentamos, he enviado sus Radiation protection practitioner a su farmacia.  Programe hoy una visita de seguimiento de 1 meses para recargas y [employer] de Interior and spatial designer.   TENGA EN CUENTA: Si se realiz alguna prueba de laboratorio, infrmenos si no ha recibido [employer]. Es posible que vea sus resultados en MyChart antes de que tengamos la oportunidad de Stage manager, pero lo llamaremos una vez que los hayamos revisado. Si solicitamos alguna REFERENCIA hoy, infrmenos si no ha recibido noticias de su oficina en la prxima semana. Infrmenos a travs de MyChart si necesita RECARGAS o pdale a su farmacia que nos enve la solicitud. [employer] para comunicarse conmigo o con cualquier miembro del personal de la oficina.  Pruebe estos consejos para mantener un estilo de vida saludable:  Es importante que haga ejercicio regularmente al menos 30 minutos 5 veces por semana.  Piense en lo que comer, planifique con anticipacin.  Elija alimentos integrales y piense en alimentos "limpios, verdes, frescos o congelados" en lugar de alimentos enlatados, procesados o envasados que son ms azucarados, salados y Scientist, physiological.  Entre el 70 y el 75 % de los alimentos que consuma deben ser vegetales frescos y protenas.  2 o 3 comidas diarias con refrigerios saludables entre comidas, pero deben ser frutas, protenas o vegetales enteros.  Trate de comer durante un perodo de 10 horas cuando est activo, por ejemplo, de 7 a. m. a 5 p. m., y luego DETNGASE despus de su ltima comida del da, bebiendo solo agua.  Los perodos de alimentacin ms cortos, de 6 a 8 horas, estn mostrando beneficios en las enfermedades cardacas y la regulacin del Production assistant, radio.  [employer]! Trate de beber 64 onzas diarias = 8 tazas, ninguna otra bebida es tan saludable! El jugo de fruta se disfruta mejor de una manera  saludable, COMIENDO la fruta.

## 2023-05-18 ENCOUNTER — Encounter: Payer: Self-pay | Admitting: Family

## 2023-05-18 DIAGNOSIS — E039 Hypothyroidism, unspecified: Secondary | ICD-10-CM | POA: Insufficient documentation

## 2023-05-18 NOTE — Assessment & Plan Note (Signed)
chronic taking Euthyrox daily sending refill advised to return for  CPE w/labs f/u 1 month

## 2023-05-18 NOTE — Assessment & Plan Note (Signed)
chronic hx of Ozempic, lost weight, but developed allergic reaction may tolerate different GLP-1, will discuss next visit Wt. Loss strategies reviewed including portion control, less carbs including sweets, eating most of calories earlier in day, drinking 64oz water qd, and establishing daily exercise routine.  f/u 1 mo

## 2023-05-18 NOTE — Assessment & Plan Note (Addendum)
chronic, stable  taking Metformin 500mg  ER daily. Last A1C 5.8 - 02/2023 sending refill advised to return for  CPE w/labs f/u 1 month

## 2023-05-18 NOTE — Assessment & Plan Note (Signed)
chronic taking Lipitor 20mg  daily sending refill advised to return for  CPE w/labs f/u 1 month

## 2023-05-18 NOTE — Assessment & Plan Note (Addendum)
chronic, stable taking Losartan-HCTZ 50-12.5mg  daily BP in good range sending refill last GFR 02/2023 - 46 advised to return for  CPE w/labs f/u 1 month

## 2023-06-11 ENCOUNTER — Ambulatory Visit: Payer: Commercial Managed Care - HMO | Admitting: Family

## 2023-06-11 ENCOUNTER — Encounter: Payer: Self-pay | Admitting: Family

## 2023-06-11 VITALS — BP 121/73 | HR 70 | Temp 97.0°F | Ht 60.0 in | Wt 212.0 lb

## 2023-06-11 DIAGNOSIS — E1159 Type 2 diabetes mellitus with other circulatory complications: Secondary | ICD-10-CM

## 2023-06-11 DIAGNOSIS — Z1211 Encounter for screening for malignant neoplasm of colon: Secondary | ICD-10-CM

## 2023-06-11 DIAGNOSIS — E1169 Type 2 diabetes mellitus with other specified complication: Secondary | ICD-10-CM

## 2023-06-11 DIAGNOSIS — Z7984 Long term (current) use of oral hypoglycemic drugs: Secondary | ICD-10-CM

## 2023-06-11 DIAGNOSIS — E785 Hyperlipidemia, unspecified: Secondary | ICD-10-CM | POA: Diagnosis not present

## 2023-06-11 DIAGNOSIS — Z1231 Encounter for screening mammogram for malignant neoplasm of breast: Secondary | ICD-10-CM

## 2023-06-11 DIAGNOSIS — E039 Hypothyroidism, unspecified: Secondary | ICD-10-CM | POA: Diagnosis not present

## 2023-06-11 DIAGNOSIS — F411 Generalized anxiety disorder: Secondary | ICD-10-CM | POA: Diagnosis not present

## 2023-06-11 DIAGNOSIS — I152 Hypertension secondary to endocrine disorders: Secondary | ICD-10-CM

## 2023-06-11 DIAGNOSIS — E119 Type 2 diabetes mellitus without complications: Secondary | ICD-10-CM

## 2023-06-11 DIAGNOSIS — M5416 Radiculopathy, lumbar region: Secondary | ICD-10-CM

## 2023-06-11 LAB — LIPID PANEL
Cholesterol: 88 mg/dL (ref 0–200)
HDL: 48.1 mg/dL (ref >39.00)
LDL Cholesterol: 18 mg/dL (ref 0–99)
NonHDL: 39.91
Total CHOL/HDL Ratio: 2
Triglycerides: 112 mg/dL (ref 0.0–149.0)
VLDL: 22.4 mg/dL (ref 0.0–40.0)

## 2023-06-11 LAB — COMPREHENSIVE METABOLIC PANEL WITH GFR
ALT: 15 U/L (ref 0–35)
AST: 23 U/L (ref 0–37)
Albumin: 4.1 g/dL (ref 3.5–5.2)
Alkaline Phosphatase: 80 U/L (ref 39–117)
BUN: 22 mg/dL (ref 6–23)
CO2: 28 meq/L (ref 19–32)
Calcium: 9.7 mg/dL (ref 8.4–10.5)
Chloride: 102 meq/L (ref 96–112)
Creatinine, Ser: 1.06 mg/dL (ref 0.40–1.20)
GFR: 52.33 mL/min — ABNORMAL LOW (ref >60.00)
Glucose, Bld: 90 mg/dL (ref 70–99)
Potassium: 4.4 meq/L (ref 3.5–5.1)
Sodium: 137 meq/L (ref 135–145)
Total Bilirubin: 0.2 mg/dL (ref 0.2–1.2)
Total Protein: 7.5 g/dL (ref 6.0–8.3)

## 2023-06-11 LAB — T4, FREE: Free T4: 1 ng/dL (ref 0.60–1.60)

## 2023-06-11 LAB — TSH: TSH: 2 u[IU]/mL (ref 0.35–5.50)

## 2023-06-11 LAB — HEMOGLOBIN A1C: Hgb A1c MFr Bld: 5.8 % (ref 4.6–6.5)

## 2023-06-11 MED ORDER — BUSPIRONE HCL 5 MG PO TABS
5.0000 mg | ORAL_TABLET | Freq: Two times a day (BID) | ORAL | 2 refills | Status: DC
Start: 2023-06-11 — End: 2023-12-05

## 2023-06-11 NOTE — Patient Instructions (Addendum)
  Fue muy lindo verte hoy!   Revisar los [place] de su laboratorio a Games developer de Clinical cytogeneticist en [employer] o lo llamaremos. Busque Mucinex genrico para la tos. Contine tomando sus medicamentos diariamente. Te he enviado el nuevo medicamento para tus nervios, Buspirona, sigue las instrucciones del frasco. Programe una visita de seguimiento durante 2 semanas para que podamos comenzar con un nuevo medicamento para perder peso y Chiropractor su dolor en las piernas con ms detalle.    PLEASE NOTE:  If you had any lab tests please let us know if you have not heard back within a few days. You may see your results on MyChart before we have a chance to review them but we will give you a call once they are reviewed by [place]. If we ordered any referrals today, please let us know if you have not heard from their office within the next week.

## 2023-06-14 DIAGNOSIS — M5416 Radiculopathy, lumbar region: Secondary | ICD-10-CM | POA: Insufficient documentation

## 2023-06-14 DIAGNOSIS — F411 Generalized anxiety disorder: Secondary | ICD-10-CM | POA: Insufficient documentation

## 2023-06-14 NOTE — Assessment & Plan Note (Signed)
chronic, stable taking Levothyroxine daily sending refill checking labs today f/u 6-12 month

## 2023-06-14 NOTE — Assessment & Plan Note (Signed)
chronic taking Lipitor 20mg  daily sending refill checking labs today f/u 6-49m month

## 2023-06-14 NOTE — Assessment & Plan Note (Signed)
chronic pt reports hx of taking Tranxene in [place] - [place], advised on ill affects of this & add potential- prefer to prescribe only as prn discussed other options sending Buspar 5mg  bid, advised on use & SE f/u 3 weeks

## 2023-06-14 NOTE — Assessment & Plan Note (Addendum)
chronic hx of Ozempic via Cardiology, lost weight, but developed allergic reaction may tolerate different GLP-1, will discuss next visit, but A1C wnl Wt. Loss strategies reviewed including portion control, less carbs including sweets, eating most of calories earlier in day, drinking 64oz water qd, and establishing daily exercise routine.  f/u 3 mo

## 2023-06-14 NOTE — Assessment & Plan Note (Addendum)
chronic, stable  taking Metformin 500mg  ER daily. Last A1C 5.8 - 05/2023 sending refill pt reports being on Ozempic in past for weight loss & DM rechecking labs today f/u 6 month

## 2023-06-14 NOTE — Assessment & Plan Note (Addendum)
chronic, Unstable unsure today if neuropathy or circulatory problem, difficulty understanding d/t language barrier pain in the thighs and down the leg, worse at night per review of EMR, pt seen in 2022 by [facility] & did PT for about 5 mos; prior to being seen she had been using a TENS unit and Naproxen w/o relief  in 2023 given Gabapentin at bedtime but stopped as made her hyper, anxious advised to try Capsaicin cream  pt stands for 5-6 hours with her job 5-6d/week pt has hx of lumbar facet degeneration will discuss further next visit, hx of clots? any past studies? f/u 3 weeks

## 2023-06-15 ENCOUNTER — Other Ambulatory Visit: Payer: Self-pay | Admitting: Family

## 2023-06-15 DIAGNOSIS — E1169 Type 2 diabetes mellitus with other specified complication: Secondary | ICD-10-CM

## 2023-06-15 MED ORDER — ATORVASTATIN CALCIUM 10 MG PO TABS: 10.0000 mg | ORAL_TABLET | Freq: Every day | ORAL | 1 refills | Status: DC

## 2023-06-15 NOTE — Progress Notes (Signed)
Cholesterol is very low, asking her to cut her Lipitor in half, take 1/2 pill daily. Thyroid & A1C are normal, kidney function is down, needs to drink at least 2L water every day, and avoid Ibuprofen, Advil, ok to take Tylenol or generic Aleve (1 pill bid) for pain or fever. Thx

## 2023-06-20 NOTE — Assessment & Plan Note (Signed)
chronic, stable taking Losartan-HCTZ 50-12.5mg  daily BP in good range sending refill last GFR 02/2023 - 52 f/u 6 month

## 2023-06-24 NOTE — Progress Notes (Unsigned)
   Patient ID: Nina Patterson, female    DOB: 07-15-1951, 72 y.o.   MRN: [id number]  No chief complaint on file.  back/leg pain, radiculopathy - hx of Gabapentin, Naproxen, no muscle relaxers? steroids? hx of PT & ortho  wt loss - hx of Ozempic - caused loss of voice - last A1C 5.8 - ever on Trulicity? Victoza?    Assessment & Plan:  There are no diagnoses linked to this encounter.  Subjective:    Outpatient Medications Prior to Visit  Medication Sig Dispense Refill   acetaminophen (TYLENOL) 650 MG CR tablet Take 650 mg by mouth every 8 (eight) hours as needed for pain.     atorvastatin (LIPITOR) 10 MG tablet Take 1 tablet (10 mg total) by mouth at bedtime. 90 tablet 1   busPIRone (BUSPAR) 5 MG tablet Take 1 tablet (5 mg total) by mouth 2 (two) times daily. For Anxiety. 60 tablet 2   cholecalciferol (VITAMIN D3) 25 MCG (1000 UNIT) tablet Take 1,000 Units by mouth daily.     EQ ASPIRIN ADULT LOW DOSE 81 MG EC tablet Take 81 mg by mouth daily.     FOLIC ACID PO Take by mouth.     levothyroxine (SYNTHROID) 75 MCG tablet Take 1 tablet (75 mcg total) by mouth daily. 90 tablet 1   losartan-hydrochlorothiazide (HYZAAR) 50-12.5 MG tablet Take 1 tablet by mouth daily. 90 tablet 1   metFORMIN (GLUCOPHAGE-XR) 500 MG 24 hr tablet Take 1 tablet (500 mg total) by mouth daily. 90 tablet 1   Omega-3 Fatty Acids (FISH OIL) 1000 MG CPDR Take 1 tablet by mouth in the morning, at noon, and at bedtime.     No facility-administered medications prior to visit.   Past Medical History:  Diagnosis Date   Class 2 severe obesity due to excess calories with serious comorbidity and body mass index (BMI) of 39.0 to 39.9 in adult [facility]) 08/07/2021   Diabetes mellitus without complication (HCC)    Hypertension    Hypothyroidism    Osteoarthritis    Primary hypertension 08/07/2021   No past surgical history on file. Allergies  Allergen Reactions   Semaglutide Itching and Rash      Objective:     Physical Exam Vitals and nursing note reviewed.  Constitutional:      Appearance: Normal appearance. She is obese.  Cardiovascular:     Rate and Rhythm: Normal rate and regular rhythm.  Pulmonary:     Effort: Pulmonary effort is normal.     Breath sounds: Normal breath sounds.  Musculoskeletal:        General: Normal range of motion.  Skin:    General: Skin is warm and dry.  Neurological:     Mental Status: She is alert.  Psychiatric:        Mood and Affect: Mood normal.        Behavior: Behavior normal.    There were no vitals taken for this visit. Wt Readings from Last 3 Encounters:  06/11/23 212 lb (96.2 kg)  05/14/23 211 lb 6.4 oz (95.9 kg)  12/26/21 190 lb 7.6 oz (86.4 kg)       Dulce Sellar, NP

## 2023-06-24 NOTE — Patient Instructions (Incomplete)
It was very nice to see you today!

## 2023-06-25 ENCOUNTER — Ambulatory Visit (INDEPENDENT_AMBULATORY_CARE_PROVIDER_SITE_OTHER): Payer: Managed Care, Other (non HMO) | Admitting: Family

## 2023-06-25 ENCOUNTER — Encounter: Payer: Self-pay | Admitting: Family

## 2023-06-25 VITALS — BP 121/68 | HR 85 | Temp 98.2°F | Ht 60.0 in | Wt 212.0 lb

## 2023-06-25 DIAGNOSIS — M47816 Spondylosis without myelopathy or radiculopathy, lumbar region: Secondary | ICD-10-CM

## 2023-06-25 DIAGNOSIS — E8881 Metabolic syndrome: Secondary | ICD-10-CM

## 2023-06-25 DIAGNOSIS — E1159 Type 2 diabetes mellitus with other circulatory complications: Secondary | ICD-10-CM | POA: Diagnosis not present

## 2023-06-25 DIAGNOSIS — R200 Anesthesia of skin: Secondary | ICD-10-CM | POA: Insufficient documentation

## 2023-06-25 DIAGNOSIS — E119 Type 2 diabetes mellitus without complications: Secondary | ICD-10-CM

## 2023-06-25 DIAGNOSIS — R0602 Shortness of breath: Secondary | ICD-10-CM | POA: Diagnosis not present

## 2023-06-25 DIAGNOSIS — Z7985 Long-term (current) use of injectable non-insulin antidiabetic drugs: Secondary | ICD-10-CM

## 2023-06-25 DIAGNOSIS — I152 Hypertension secondary to endocrine disorders: Secondary | ICD-10-CM

## 2023-06-25 DIAGNOSIS — M5416 Radiculopathy, lumbar region: Secondary | ICD-10-CM

## 2023-06-25 MED ORDER — CYCLOBENZAPRINE HCL 5 MG PO TABS: 5.0000 mg | ORAL_TABLET | Freq: Three times a day (TID) | ORAL | 2 refills | Status: AC | PRN

## 2023-06-25 MED ORDER — TRULICITY 0.75 MG/0.5ML ~~LOC~~ SOAJ
0.7500 mg | SUBCUTANEOUS | 2 refills | Status: DC
Start: 2023-06-25 — End: 2023-07-23

## 2023-06-25 NOTE — Assessment & Plan Note (Signed)
chronic, Unstable believe r/t lumbar facet DD - hx of PT for back pain & did well sending Flexeril 5-10mg   referring to Ortho f/u prn

## 2023-06-25 NOTE — Assessment & Plan Note (Signed)
chronic, stable, but w/ morbid obesity, pt wants to lose weight taking Metformin 500mg  ER daily. Last A1C 5.8 - 05/2023 pt reports being on Ozempic in past for weight loss & DM, but caused throat hoarseness sending Trulicity 0.75mg , will start PA via pharmacy team f/u 1 month after starting medication

## 2023-06-26 ENCOUNTER — Other Ambulatory Visit (HOSPITAL_COMMUNITY): Payer: Self-pay

## 2023-07-01 ENCOUNTER — Telehealth: Payer: Self-pay | Admitting: Family

## 2023-07-01 ENCOUNTER — Encounter: Payer: Self-pay | Admitting: Family

## 2023-07-01 ENCOUNTER — Ambulatory Visit (INDEPENDENT_AMBULATORY_CARE_PROVIDER_SITE_OTHER): Payer: Managed Care, Other (non HMO) | Admitting: Family

## 2023-07-01 VITALS — BP 118/82 | HR 80 | Wt 207.0 lb

## 2023-07-01 DIAGNOSIS — K529 Noninfective gastroenteritis and colitis, unspecified: Secondary | ICD-10-CM

## 2023-07-01 MED ORDER — DIPHENOXYLATE-ATROPINE 2.5-0.025 MG PO TABS: 1.0000 | ORAL_TABLET | Freq: Four times a day (QID) | ORAL | 0 refills | Status: DC | PRN

## 2023-07-01 MED ORDER — ONDANSETRON 4 MG PO TBDP: 4.0000 mg | ORAL_TABLET | Freq: Three times a day (TID) | ORAL | 0 refills | Status: DC | PRN

## 2023-07-01 NOTE — Patient Instructions (Addendum)
  Fue muy lindo verte hoy!  Le he enviado medicamentos para aliviar sus nuseas y Nina Patterson. Espera hasta maana para empezar el Lomotil (para la diarrea) si es posible. Est bien comenzar con Ondansetron (Zofran) para las nuseas lo antes posible. Siga una dieta BRAT; consulte la informacin a continuacin. Por favor aumente su ingesta de lquidos. La hidratacin es clave!  Si no puede retener lquidos por va oral o si los sntomas empeoran de forma aguda, vaya a la sala de emergencias, ya que es posible que necesite lquidos por va intravenosa.   Cuando tienes diarrea, los alimentos que consumes y tus hbitos alimentarios son Engineer, production. Elegir los [employer] y bebidas adecuados puede ayudar a Actuary. Adems, debido a que la diarrea puede durar [employer] [address]  [address], es necesario [employer] lquidos y Customer service manager perdidos (como sodio, potasio y Landscape architect) para Contractor a Agricultural engineer.  QU PAUTAS GENERALES DEBO SEGUIR? ? Beba lentamente 1 taza (8 oz) de lquido por cada episodio de diarrea. Si est tomando suficiente lquido, su orina ser clara o de color amarillo plido. ? Consuma alimentos con almidn. Algunas buenas opciones incluyen arroz blanco, tostadas blancas, pasta, cereales bajos en fibra, papas al horno (sin piel), galletas saladas y bagels. ? Evite porciones grandes de vegetales cocidos. ? Limite la fruta a dos porciones por da. Una porcin es  taza o 1 trozo pequeo. ? Elija alimentos con menos de 2 g de fibra por porcin. ? Limite las grasas a menos de 8 cucharaditas (38 g) por da. ? Evite los alimentos fritos. ? Consuma alimentos que contengan probiticos. Los probiticos se Engineer, maintenance (IT) en ciertos productos lcteos. ? Evite alimentos y bebidas que puedan aumentar la velocidad a la que los alimentos se mueven a travs del estmago y los intestinos (tracto gastrointestinal). Las cosas que se deben evitar incluyen: ? Alimentos ricos en fibra, como  frutos secos, frutas y verduras crudas, nueces, semillas y alimentos integrales. ? Alimentos picantes y alimentos ricos en grasas. ? Alimentos y bebidas endulzados con jarabe de maz con alto contenido de fructosa, miel o alcoholes de azcar como xilitol, sorbitol y manitol.  QU DEBO HACER SI ME DESHIDRATO? A veces, la diarrea puede provocar deshidratacin. Los signos de deshidratacin incluyen [place] y boca y piel secas. Si cree que est deshidratado, debe rehidratarse con una solucin de rehidratacin oral. Estas soluciones se pueden comprar en farmacias, tiendas minoristas o en lnea.  Beba de  a 1 taza (120 a 240 ml) de solucin de rehidratacin oral cada vez que tenga un episodio de diarrea. Si beber esta cantidad empeora su diarrea, intente beber cantidades ms pequeas con ms frecuencia. Por ejemplo, beba de 1 a 3 cucharaditas (5 a 15 ml) cada 5 a 10 minutos.  Neomia Dear regla general para mantenerse hidratado es beber entre 1 y 2 litros de lquido al [employer]. Hable con su proveedor de atencin [employer] cantidad especfica que debe beber Management consultant. Beba suficientes lquidos para mantener la orina clara o de color amarillo plido.

## 2023-07-01 NOTE — Progress Notes (Signed)
Patient ID: Nina Patterson, female    DOB: 1951/03/17, 72 y.o.   MRN: [id number]  Chief Complaint  Patient presents with   Diarrhea    Started yesterday; Pt states she had no control over her BM. Pt states over the course of yesterday, she had a BM 15 times. Pt c/o of pain in her lower abdominal area that comes and goes.  Pt also c/o of burping with a bad odor. Pt states there has been no recent changes in her diet to cause the issue.   *Discussed the use of AI scribe software for clinical note transcription with the patient, who gave verbal consent to proceed.  History of Present Illness   The patient, with a history o fanxiety, T2DM, HTN, & chronic pain managed with recent new RX of cyclobenzaprine and buspirone, presents with sudden onset diarrhea and nausea. The diarrhea began yesterday, initially loose and brown, but quickly became watery and explosive. The patient reports going to the bathroom approximately 15 times yesterday and hourly from 1 AM to 7 AM today. The patient also reports a significant amount of gas w/belching. The patient has not eaten since breakfast yesterday due to fear of exacerbating the diarrhea and lack of appetite. The patient also reports a dry mouth and strange taste, which began before the onset of the diarrhea. The patient denies vomiting but reports feeling nauseous. The patient has not started any other new medications recently, still waiting on Trulicity approval.     Assessment & Plan:     Acute gastroenteritis - Onset yesterday with multiple episodes of loose, watery, and explosive stools. No associated vomiting. Dry mouth and strange taste noted prior to onset of diarrhea. Not a common SE of Flexeril. No similar symptoms in household contacts. -Start [employer] (bananas, rice, apples, toast) to help slow down diarrhea. -Ensure adequate hydration. -Start anti-nausea medication as soon as possible, Zofran 4mg  ODT q8h prn. -If more than 15  episodes of diarrhea before noon, start anti-diarrhea medication to prevent dehydration, otherwise try to wait until tomorrow. -Instructions on managing sx translated in Spanish. -Provide work note for absence until Thursday. -Check in if symptoms worsen or do not improve.    Subjective:    Outpatient Medications Prior to Visit  Medication Sig Dispense Refill   acetaminophen (TYLENOL) 650 MG CR tablet Take 650 mg by mouth every 8 (eight) hours as needed for pain.     atorvastatin (LIPITOR) 10 MG tablet Take 1 tablet (10 mg total) by mouth at bedtime. 90 tablet 1   busPIRone (BUSPAR) 5 MG tablet Take 1 tablet (5 mg total) by mouth 2 (two) times daily. For Anxiety. 60 tablet 2   cholecalciferol (VITAMIN D3) 25 MCG (1000 UNIT) tablet Take 1,000 Units by mouth daily.     cyclobenzaprine (FLEXERIL) 5 MG tablet Take 1-2 tablets (5-10 mg total) by mouth 3 (three) times daily as needed (For leg pain. May cause drowsiness and can take 2 pills at bedtime.). 60 tablet 2   Dulaglutide (TRULICITY) 0.75 MG/0.5ML SOPN Inject 0.75 mg into the skin once a week. 2 mL 2   EQ ASPIRIN ADULT LOW DOSE 81 MG EC tablet Take 81 mg by mouth daily.     FOLIC ACID PO Take by mouth.     levothyroxine (SYNTHROID) 75 MCG tablet Take 1 tablet (75 mcg total) by mouth daily. 90 tablet 1   losartan-hydrochlorothiazide (HYZAAR) 50-12.5 MG tablet Take 1 tablet by mouth daily. 90 tablet 1  metFORMIN (GLUCOPHAGE-XR) 500 MG 24 hr tablet Take 1 tablet (500 mg total) by mouth daily. 90 tablet 1   Omega-3 Fatty Acids (FISH OIL) 1000 MG CPDR Take 1 tablet by mouth in the morning, at noon, and at bedtime.     No facility-administered medications prior to visit.   Past Medical History:  Diagnosis Date   Class 2 severe obesity due to excess calories with serious comorbidity and body mass index (BMI) of 39.0 to 39.9 in adult [facility]) 08/07/2021   Diabetes mellitus without complication (HCC)    Hypertension    Hypothyroidism     Osteoarthritis    Primary hypertension 08/07/2021   History reviewed. No pertinent surgical history. Allergies  Allergen Reactions   Semaglutide Itching and Rash      Objective:    Physical Exam Vitals and nursing note reviewed.  Constitutional:      Appearance: Normal appearance.  Cardiovascular:     Rate and Rhythm: Normal rate and regular rhythm.  Pulmonary:     Effort: Pulmonary effort is normal.     Breath sounds: Normal breath sounds.  Musculoskeletal:        General: Normal range of motion.  Skin:    General: Skin is warm and dry.  Neurological:     Mental Status: She is alert.  Psychiatric:        Mood and Affect: Mood normal.        Behavior: Behavior normal.   BP 118/82   Pulse 80   Wt 207 lb (93.9 kg)   SpO2 96%   BMI 40.43 kg/m  Wt Readings from Last 3 Encounters:  07/01/23 207 lb (93.9 kg)  06/25/23 212 lb (96.2 kg)  06/11/23 212 lb (96.2 kg)       Dulce Sellar, NP

## 2023-07-01 NOTE — Telephone Encounter (Signed)
Advised to see pcp within 4 hrs. 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 She has not eaten. And has nausea but has not vomited. Sees Nina Patterson (not listed) Translation Yes Translation Language Spanish Nurse Assessment Nurse: Nina Cloud, RN, Nina Patterson Date/Time Nina Patterson Time): 06/30/2023 7:18:54 PM Confirm and document reason for call. If symptomatic, describe symptoms. ---Caller reports onset of diarrhea this morning after eating and going to work. When returning home she had lots of diarrhea. She has been able to urinate. She feels nausea but has not vomited. Last night she had a dry mouth and woke up with a dry mouth. She does not have fever. Does the patient have any new or worsening symptoms? ---Yes Will a triage be completed? ---Yes Related visit to physician within the last 2 weeks? ---No Does the PT have any chronic conditions? (i.e. diabetes, asthma, this includes High risk factors for pregnancy, etc.) ---No Is this a behavioral health or substance abuse call? ---No Guidelines Guideline Title Affirmed Question Affirmed Notes Nurse Date/Time (Eastern Time) Diarrhea [1] SEVERE diarrhea (e.g., 7 or more times / day more than normal) AND [2] age > 73 years Nina Patterson 06/30/2023 7:21:36 PM Disp. Time  Nina Patterson Time) Disposition Final User 06/30/2023 7:38:37 PM See HCP within 4 Hours (or PCP triage) Yes Nina Cloud, RN, Nina Patterson Final Disposition 06/30/2023 7:38:37 PM See HCP within 4 Hours (or PCP triage) Yes Nina Cloud, RN, Nina Patterson Disagree/Comply Disagree Caller Understands Yes PreDisposition Call Doctor Care Advice Given Per Guideline SEE HCP (OR PCP TRIAGE) WITHIN 4 HOURS: * IF OFFICE WILL BE CLOSED AND NO PCP (PRIMARY CARE PROVIDER) SECOND-LEVEL TRIAGE: You need to be seen within the next 3 or 4 hours. A nearby Urgent Care Center [facility]) is often a good source of care. Another choice is to go to the ED. Go sooner if you become worse. DRINK CLEAR FLUIDS: * Drink more fluids. * Sip water or a half-strength sports drink (such as Gatorade, Powerade; mix half and half with water). CALL BACK IF: * You become worse CARE ADVICE given per Diarrhea (Adult) guideline. * Other options: Sip an oral rehydration solution (such as Pedialyte, Rehydralyte). Comments User: Nina Grief, RN Date/Time Nina Patterson Time): 06/30/2023 7:23:08 PM She has tried chamomille tea with lime, coconut water, and lots of fluids to stay hydrated no OTC meds. User: Nina Grief, RN Date/Time Nina Patterson Time): 06/30/2023 7:26:11 PM Currently on cyclobenzaprine 5 mg User: Nina Grief, RN Date/Time Nina Patterson Time): 06/30/2023 7:37:55 PM Caller states she went diarrhea about 15 times today. She has not had continuation of diarrhea for the last 15 minutes. She prefers to refer to office as she explains that she has a lot of insurance issures with [employer]. She will continue to treat her symptoms at home and refer to office in the morning when open. Additional advice given for dry mouth- candies, lozenges, honey, dental hygiene and mouth wash before bed.  User: Nina Grief, RN Date/Time Nina Patterson Time): 06/30/2023 7:38:36 PM Caller has not had accident yet, she considers buying diapers as she is afraid she  will soil herself. Referrals GO TO FACILITY REFUSED

## 2023-07-02 ENCOUNTER — Other Ambulatory Visit: Payer: Self-pay

## 2023-07-02 ENCOUNTER — Ambulatory Visit (INDEPENDENT_AMBULATORY_CARE_PROVIDER_SITE_OTHER): Payer: Managed Care, Other (non HMO) | Admitting: Orthopaedic Surgery

## 2023-07-02 ENCOUNTER — Encounter: Payer: Self-pay | Admitting: Orthopaedic Surgery

## 2023-07-02 VITALS — BP 98/64 | HR 88 | Ht 60.0 in | Wt 207.0 lb

## 2023-07-02 DIAGNOSIS — M47816 Spondylosis without myelopathy or radiculopathy, lumbar region: Secondary | ICD-10-CM | POA: Diagnosis not present

## 2023-07-02 NOTE — Progress Notes (Unsigned)
Office Visit Note   Patient: Nina Patterson           Date of Birth: 1951-01-21           MRN: [id number] Visit Date: 07/02/2023              Requested by: Dulce Sellar, NP [address] [place],  [place] [address] PCP: Dulce Sellar, NP   Assessment & Plan: Visit Diagnoses:  1. Facet degeneration of lumbar region     Plan: Will set up some physical therapy [facility] outpatient.  She has gotten improvement in the past with therapy with both strength and improved ambulation.  She will follow-up after the therapy.  Follow-Up Instructions: No follow-ups on file.   Orders:  Orders Placed This Encounter  Procedures   XR Lumbar Spine 2-3 Views   No orders of the defined types were placed in this encounter.     Procedures: No procedures performed   Clinical Data: No additional findings.   Subjective: Chief Complaint  Patient presents with   Lower Back - Pain    HPI 72 year old female with back pain left leg numbness that radiates down to her knee that is been present for about a year pain seems to be worse at night she states sometimes her muscles feel tight and night.  She not able to walk long distance she states she can get short of breath after walking up the stairs.  She has tried some Flexeril which may have helped some degree.  No associated bowel or bladder symptoms.  She does have type 2 diabetes, hypertension, hyperlipidemia, anxiety and morbid obesity BMI 40.  She complains of weakness in her lower extremities with activities.  Review of Systems all systems noncontributory to HPI.   Objective: Vital Signs: BP 98/64   Pulse 88   Ht 5' (1.524 m)   Wt 207 lb (93.9 kg)   BMI 40.43 kg/m   Physical Exam Constitutional:      Appearance: She is well-developed.  HENT:     Head: Normocephalic.     Right Ear: External ear normal.     Left Ear: External ear normal. There is no impacted cerumen.  Eyes:     Pupils: Pupils are  equal, round, and reactive to light.  Neck:     Thyroid: No thyromegaly.     Trachea: No tracheal deviation.  Cardiovascular:     Rate and Rhythm: Normal rate.  Pulmonary:     Effort: Pulmonary effort is normal.  Abdominal:     Palpations: Abdomen is soft.  Musculoskeletal:     Cervical back: No rigidity.  Skin:    General: Skin is warm and dry.  Neurological:     Mental Status: She is alert and oriented to person, place, and time.  Psychiatric:        Behavior: Behavior normal.     Ortho Exam patient has some weakness with hip flexion quad testing.  Uses her arms to get from sitting to standing.  Negative logroll hips reflexes are 2+ and symmetrical.  Specialty Comments:  No specialty comments available.  Imaging: No results found.   PMFS History: Patient Active Problem List   Diagnosis Date Noted   Numbness in left leg 06/25/2023   Generalized anxiety disorder 06/14/2023   Lumbar back pain with radiculopathy affecting lower extremity 06/14/2023   Acquired hypothyroidism 05/18/2023   Hyperlipidemia due to type 2 diabetes mellitus (HCC) 05/14/2023   Hypertension associated  with type 2 diabetes mellitus (HCC) 05/14/2023   Metabolic syndrome 08/07/2021   Type 2 diabetes mellitus without complication, without long-term current use of insulin (HCC) 08/07/2021   Morbid obesity (HCC) 08/07/2021   Other spondylosis with radiculopathy, cervical region 07/31/2021   Facet degeneration of lumbar region 04/18/2021   Past Medical History:  Diagnosis Date   Class 2 severe obesity due to excess calories with serious comorbidity and body mass index (BMI) of 39.0 to 39.9 in adult [facility]) 08/07/2021   Diabetes mellitus without complication (HCC)    Hypertension    Hypothyroidism    Osteoarthritis    Primary hypertension 08/07/2021    History reviewed. No pertinent family history.  History reviewed. No pertinent surgical history. Social History   Occupational History   Not on  file  Tobacco Use   Smoking status: Never   Smokeless tobacco: Never  Substance and Sexual Activity   Alcohol use: Not Currently   Drug use: Not on file   Sexual activity: Not on file

## 2023-07-04 ENCOUNTER — Other Ambulatory Visit (HOSPITAL_COMMUNITY): Payer: Self-pay

## 2023-07-04 ENCOUNTER — Telehealth: Payer: Self-pay

## 2023-07-04 NOTE — Telephone Encounter (Signed)
Pharmacy Patient Advocate Encounter   Received notification from Pt Calls Messages that prior authorization for Trulicity 0.75mg /0.49ml is required/requested.   Insurance verification completed.   The patient is insured through [employer] .   Per test claim: PA required; PA submitted to [employer] via Phone Key/confirmation #/[id number] Status is pending

## 2023-07-07 NOTE — Telephone Encounter (Signed)
Pharmacy Patient Advocate Encounter  Received notification from [employer] that Prior Authorization for Trulicity 0.75mg /0.37ml has been DENIED.  Full denial letter will be uploaded to the media tab. See denial reason below.   PA #/Case ID/Reference #: [id number]

## 2023-07-08 NOTE — Telephone Encounter (Signed)
please let pt know med rejected by her insurance. Can offer nutrition counseling or the Medical wt loss clinic for more guidance, let me know.

## 2023-07-11 NOTE — Telephone Encounter (Signed)
Spoke w/ pt and informed her of this . Will keep appt on 10/30 to discuss further guidance .

## 2023-07-16 NOTE — Progress Notes (Signed)
Cardiology Office Note:    Date:  07/29/2023   ID:  Nina Patterson, DOB 12-09-1950, MRN [id number]  PCP:  Dulce Sellar, NP    [facility] Providers Cardiologist:  Jodelle Red, MD Cardiology APP:  Marcelino Duster, [place]     Referring MD: Dulce Sellar, NP   No chief complaint on file. Fatigue/DOE  History of Present Illness:    Nina Patterson is a 72 y.o. female with a hx of HTN, HLD, metabolic syndrome, DM, and obesity.     She established care with Dr. Cristal Deer 07/2021 and was referred to pharmD for GLP-1 agonist. She has not been seen since.   She presents today for follow up. BP 138/70.  She says in [place] she had 24 hr blood pressure that showed nocturnal BP of 180 when lying flat. She was treated for HTN - used to take diovan in [place]. PCP now prescribes losartan-hydrochlorothiazide. She does not take her BP at home, but does own a cuff.   She also reports fatigue and DOE with 1 flight of stairs x 8 months. Had extreme weight gain 1 year ago and has not been able to lose the weight. She takes ashwaganda for sleep.  She was referred to pharmD for ozempic but started losing her voice and stopped. She has a thyroid nodule. I suspect her SOB/DOE and fatigue is due to extra weight gain. Mild LE edema.    Past Medical History:  Diagnosis Date   Class 2 severe obesity due to excess calories with serious comorbidity and body mass index (BMI) of 39.0 to 39.9 in adult [facility]) 08/07/2021   Diabetes mellitus without complication (HCC)    Hypertension    Hypothyroidism    Osteoarthritis    Primary hypertension 08/07/2021    No past surgical history on file.  Current Medications: Current Meds  Medication Sig   acetaminophen (TYLENOL) 650 MG CR tablet Take 650 mg by mouth every 8 (eight) hours as needed for pain.   atorvastatin (LIPITOR) 10 MG tablet Take 1 tablet (10 mg total) by mouth at bedtime.   busPIRone (BUSPAR) 5  MG tablet Take 1 tablet (5 mg total) by mouth 2 (two) times daily. For Anxiety.   cholecalciferol (VITAMIN D3) 25 MCG (1000 UNIT) tablet Take 1,000 Units by mouth daily.   cyclobenzaprine (FLEXERIL) 5 MG tablet Take 1-2 tablets (5-10 mg total) by mouth 3 (three) times daily as needed (For leg pain. May cause drowsiness and can take 2 pills at bedtime.).   EQ ASPIRIN ADULT LOW DOSE 81 MG EC tablet Take 81 mg by mouth daily.   FOLIC ACID PO Take by mouth.   levothyroxine (SYNTHROID) 75 MCG tablet Take 1 tablet (75 mcg total) by mouth daily.   losartan-hydrochlorothiazide (HYZAAR) 50-12.5 MG tablet Take 1 tablet by mouth daily.   metFORMIN (GLUCOPHAGE-XR) 500 MG 24 hr tablet Take 1 tablet (500 mg total) by mouth 2 (two) times daily with a meal.   Omega-3 Fatty Acids (FISH OIL) 1000 MG CPDR Take 1 tablet by mouth in the morning, at noon, and at bedtime.     Allergies:   Semaglutide   Social History   Socioeconomic History   Marital status: Widowed    Spouse name: Not on file   Number of children: Not on file   Years of education: Not on file   Highest education level: Not on file  Occupational History   Not on file  Tobacco Use  Smoking status: Never   Smokeless tobacco: Never  Substance and Sexual Activity   Alcohol use: Not Currently   Drug use: Not on file   Sexual activity: Not on file  Other Topics Concern   Not on file  Social History Narrative   Not on file   Social Determinants of Health   Financial Resource Strain: Not on File (01/10/2022)   Received from [facility], [place]   Financial Resource Strain    Financial Resource Strain: 0  Food Insecurity: Not on File (06/19/2023)   Received from [facility] Insecurity    Food: 0  Transportation Needs: Not on File (01/10/2022)   Received from [facility], [employer] Needs    Transportation: 0  Physical Activity: Not on File (01/10/2022)   Received from [place], [place]   Physical Activity    Physical Activity: 0  Stress:  Not on File (01/10/2022)   Received from [facility], [place]   Stress    Stress: 0  Social Connections: Not on File (06/07/2023)   Received from [facility]   Social Connections    Connectedness: 0     Family History: The patient's family history is not on file.  ROS:   Please see the history of present illness.     All other systems reviewed and are negative.  EKGs/Labs/Other Studies Reviewed:    The following studies were reviewed today:       EKG Interpretation Date/Time:  Tuesday July 29 2023 14:27:39 EST Ventricular Rate:  71 PR Interval:  140 QRS Duration:  86 QT Interval:  390 QTC Calculation: 423 R Axis:   43  Text Interpretation: Normal sinus rhythm Normal ECG No previous ECGs available Confirmed by Micah Flesher ([id number]) on 07/29/2023 2:48:41 PM    Recent Labs: 06/11/2023: ALT 15; BUN 22; Creatinine, Ser 1.06; Potassium 4.4; Sodium 137; TSH 2.00  Recent Lipid Panel    Component Value Date/Time   CHOL 88 06/11/2023 1031   TRIG 112.0 06/11/2023 1031   HDL 48.10 06/11/2023 1031   CHOLHDL 2 06/11/2023 1031   VLDL 22.4 06/11/2023 1031   LDLCALC 18 06/11/2023 1031   LDLDIRECT 45.0 07/23/2021 1518     Risk Assessment/Calculations:                Physical Exam:    VS:  BP 138/72 (BP Location: Left Arm, Patient Position: Sitting, Cuff Size: Large)   Pulse 71   Ht 5' (1.524 m)   Wt 212 lb 3.2 oz (96.3 kg)   SpO2 94%   BMI 41.44 kg/m     Wt Readings from Last 3 Encounters:  07/29/23 212 lb 3.2 oz (96.3 kg)  07/23/23 213 lb 2 oz (96.7 kg)  07/02/23 207 lb (93.9 kg)     GEN:  Well nourished, well developed in no acute distress HEENT: Normal NECK: No JVD; No carotid bruits LYMPHATICS: No lymphadenopathy CARDIAC: RRR, no murmurs, rubs, gallops RESPIRATORY:  Clear to auscultation without rales, wheezing or rhonchi  ABDOMEN: Soft, non-tender, non-distended MUSCULOSKELETAL:  No edema; No deformity  SKIN: Warm and dry NEUROLOGIC:  Alert and oriented x  3 PSYCHIATRIC:  Normal affect   ASSESSMENT:    1. Hypertension associated with type 2 diabetes mellitus (HCC)   2. DOE (dyspnea on exertion)   3. Fatigue, unspecified type   4. Lower extremity edema   5. Type 2 diabetes mellitus without complication, without long-term current use of insulin (HCC)   6. Hyperlipidemia due to type 2  diabetes mellitus (HCC)   7. Metabolic syndrome   8. Morbid obesity (HCC)    PLAN:    In order of problems listed above:  DOE / fatigue - will check an echo, but likely OHS contributing   Metabolic syndrome Obesity -  last A1c 5.8% - no longer on wegovy   Hypertension - losartan-hydrochlorothiazide 50-12.5 mg - stable CMP on 06/11/23 - will keep a BP log daily in the evenings (she works at [employer] during the day)   Hyperlipidemia  - continue 10 mg lipitor, fish oil 06/11/2023: Cholesterol 88; HDL 48.10; LDL Cholesterol 18; Triglycerides 112.0; VLDL 22.4            Medication Adjustments/Labs and Tests Ordered: Current medicines are reviewed at length with the patient today.  Concerns regarding medicines are outlined above.  Orders Placed This Encounter  Procedures   EKG 12-Lead   ECHOCARDIOGRAM COMPLETE   No orders of the defined types were placed in this encounter.   Patient Instructions  Medication Instructions:  Your physician recommends that you continue on your current medications as directed. Please refer to the Current Medication list given to you today.  *If you need a refill on your cardiac medications before your next appointment, please call your pharmacy*   Lab Work: NONE ordered at this time of appointment    Testing/Procedures: Your physician has requested that you have an echocardiogram. Echocardiography is a painless test that uses sound waves to create images of your heart. It provides your doctor with information about the size and shape of your heart and how well your heart's chambers and valves are  working. This procedure takes approximately one hour. There are no restrictions for this procedure. Please do NOT wear cologne, perfume, aftershave, or lotions (deodorant is allowed). Please arrive 15 minutes prior to your appointment time.  Please note: We ask at that you not bring children with you during ultrasound (echo/ vascular) testing. Due to room size and safety concerns, children are not allowed in the ultrasound rooms during exams. Our front office staff cannot provide observation of children in our lobby area while testing is being conducted. An adult accompanying a patient to their appointment will only be allowed in the ultrasound room at the discretion of the ultrasound technician under special circumstances. We apologize for any inconvenience.   Follow-Up: At [facility], you and your health needs are our priority.  As part of our continuing mission to provide you with exceptional heart care, we have created designated Provider Care Teams.  These Care Teams include your primary Cardiologist (physician) and Advanced Practice Providers (APPs -  Physician Assistants and Nurse Practitioners) who all work together to provide you with the care you need, when you need it.  We recommend signing up for the patient portal called "MyChart".  Sign up information is provided on this After Visit Summary.  MyChart is used to connect with patients for Virtual Visits (Telemedicine).  Patients are able to view lab/test results, encounter notes, upcoming appointments, etc.  Non-urgent messages can be sent to your provider as well.   To learn more about what you can do with MyChart, go to [email or web address].    Your next appointment:   6 week(s)  Provider:   Micah Flesher, PA-C        Other Instructions Monitor blood pressure in the evenings.    Signed, Marcelino Duster, [place]  07/29/2023 3:34 PM    [facility]

## 2023-07-23 ENCOUNTER — Encounter: Payer: Self-pay | Admitting: Family

## 2023-07-23 ENCOUNTER — Ambulatory Visit (INDEPENDENT_AMBULATORY_CARE_PROVIDER_SITE_OTHER): Payer: Managed Care, Other (non HMO) | Admitting: Family

## 2023-07-23 DIAGNOSIS — R7303 Prediabetes: Secondary | ICD-10-CM | POA: Diagnosis not present

## 2023-07-23 DIAGNOSIS — E8881 Metabolic syndrome: Secondary | ICD-10-CM

## 2023-07-23 DIAGNOSIS — R351 Nocturia: Secondary | ICD-10-CM

## 2023-07-23 MED ORDER — WEGOVY 0.25 MG/0.5ML ~~LOC~~ SOAJ: 0.2500 mg | SUBCUTANEOUS | 1 refills | Status: DC

## 2023-07-23 MED ORDER — METFORMIN HCL ER 500 MG PO TB24
500.0000 mg | ORAL_TABLET | Freq: Two times a day (BID) | ORAL | 1 refills | Status: DC
Start: 2023-07-23 — End: 2023-11-18

## 2023-07-23 NOTE — Assessment & Plan Note (Addendum)
chronic, stable, but w/ morbid obesity, pt wants to lose weight taking Metformin 500mg  ER daily, tolerating Last A1C 5.8 - 05/2023 Trulicity denied by insurance - said to max out Metformin first, believe due to her A1C will not get covered will try to send Wegovy under morbid obesity dx advised to increase Metformin to 500mg  bid f/u 1m

## 2023-07-23 NOTE — Progress Notes (Addendum)
Patient ID: Nina Patterson, female    DOB: [birth year], 72 y.o.   MRN: [id number]  Chief Complaint  Patient presents with   Diabetes    Follow up   Discussed the use of AI scribe software for clinical note transcription with the patient, who gave verbal consent to proceed.  History of Present Illness   The patient, with a history of diabetes and hypothyroidism, presents with concerns about weight gain despite making significant dietary changes. She reports eliminating sweets, flour, and dairy from her diet, yet has noticed an increase in weight. The patient expresses surprise and frustration at this, as she has been adhering to her diet and taking her prescribed medications.  The patient's diabetes is reportedly well-controlled, with a recent A1c of 5.8. She has been on metformin, which she tolerates well, and is currently taking 500mg  once daily. The patient also has a history of hypothyroidism, which is currently managed with 75mg  of levothyroxine daily. She reports no issues with this medication.  In addition to her metabolic concerns, the patient also mentions difficulty with walking due to leg issues. She expresses hope that upcoming rehabilitation will improve her mobility. The patient also reports experiencing a dry mouth at night, leading to increased water intake and frequent urination.      Assessment & Plan:     Borderline DM/Morbid Obesity - Despite dietary changes, patient reports weight gain. A1c is 5.8, indicating good glycemic control. Trulicity denied by insurance. Discussed the possibility of trying Wegovy, a medication for obesity, pending insurance approval. -Attempt to get [facility] 0.25mg  approved under the diagnosis of obesity. -Increase Metformin 500mg  ER to bid -Refer to a nutritionist for further dietary guidance. -f/u in 6 mos  Nocturia - Patient reports waking up three times a night to urinate, and has a dry mouth, leading to increased water intake  and disrupted sleep. -Advise patient to limit fluid intake before bedtime. -Recommend patient to elevate head during sleep to help with postnasal drip and cough. -Advise patient to use a Biotin spray for dry mouth at night, keep at bedside.    Subjective:    Outpatient Medications Prior to Visit  Medication Sig Dispense Refill   acetaminophen (TYLENOL) 650 MG CR tablet Take 650 mg by mouth every 8 (eight) hours as needed for pain.     atorvastatin (LIPITOR) 10 MG tablet Take 1 tablet (10 mg total) by mouth at bedtime. 90 tablet 1   busPIRone (BUSPAR) 5 MG tablet Take 1 tablet (5 mg total) by mouth 2 (two) times daily. For Anxiety. 60 tablet 2   cholecalciferol (VITAMIN D3) 25 MCG (1000 UNIT) tablet Take 1,000 Units by mouth daily.     cyclobenzaprine (FLEXERIL) 5 MG tablet Take 1-2 tablets (5-10 mg total) by mouth 3 (three) times daily as needed (For leg pain. May cause drowsiness and can take 2 pills at bedtime.). 60 tablet 2   diphenoxylate-atropine (LOMOTIL) 2.5-0.025 MG tablet Take 1 tablet by mouth 4 (four) times daily as needed for diarrhea or loose stools (Max dose of 8 tabs in 24 hours). 30 tablet 0   Dulaglutide (TRULICITY) 0.75 MG/0.5ML SOPN Inject 0.75 mg into the skin once a week. 2 mL 2   EQ ASPIRIN ADULT LOW DOSE 81 MG EC tablet Take 81 mg by mouth daily.     FOLIC ACID PO Take by mouth.     levothyroxine (SYNTHROID) 75 MCG tablet Take 1 tablet (75 mcg total) by mouth daily. 90 tablet 1  losartan-hydrochlorothiazide (HYZAAR) 50-12.5 MG tablet Take 1 tablet by mouth daily. 90 tablet 1   metFORMIN (GLUCOPHAGE-XR) 500 MG 24 hr tablet Take 1 tablet (500 mg total) by mouth daily. 90 tablet 1   Omega-3 Fatty Acids (FISH OIL) 1000 MG CPDR Take 1 tablet by mouth in the morning, at noon, and at bedtime.     ondansetron (ZOFRAN-ODT) 4 MG disintegrating tablet Take 1 tablet (4 mg total) by mouth every 8 (eight) hours as needed for nausea or vomiting. 20 tablet 0   No facility-administered  medications prior to visit.   Past Medical History:  Diagnosis Date   Class 2 severe obesity due to excess calories with serious comorbidity and body mass index (BMI) of 39.0 to 39.9 in adult [facility]) 08/07/2021   Diabetes mellitus without complication (HCC)    Hypertension    Hypothyroidism    Osteoarthritis    Primary hypertension 08/07/2021   No past surgical history on file. Allergies  Allergen Reactions   Semaglutide Itching and Rash      Objective:    Physical Exam Vitals and nursing note reviewed.  Constitutional:      Appearance: Normal appearance. She is morbidly obese.  Cardiovascular:     Rate and Rhythm: Normal rate and regular rhythm.  Pulmonary:     Effort: Pulmonary effort is normal.     Breath sounds: Normal breath sounds.  Musculoskeletal:        General: Normal range of motion.  Skin:    General: Skin is warm and dry.  Neurological:     Mental Status: She is alert.  Psychiatric:        Mood and Affect: Mood normal.        Behavior: Behavior normal.    BP 128/76 (BP Location: Left Arm, Patient Position: Sitting, Cuff Size: Large)   Pulse 79   Temp 98 F (36.7 C) (Temporal)   Ht 5' (1.524 m)   Wt 213 lb 2 oz (96.7 kg)   SpO2 96%   BMI 41.62 kg/m  Wt Readings from Last 3 Encounters:  07/23/23 213 lb 2 oz (96.7 kg)  07/02/23 207 lb (93.9 kg)  07/01/23 207 lb (93.9 kg)      Dulce Sellar, NP

## 2023-07-23 NOTE — Addendum Note (Signed)
Addended byDulce Sellar on: 07/23/2023 04:31 PM   Modules accepted: Orders

## 2023-07-24 ENCOUNTER — Other Ambulatory Visit (HOSPITAL_COMMUNITY): Payer: Self-pay

## 2023-07-24 ENCOUNTER — Telehealth: Payer: Self-pay

## 2023-07-24 NOTE — Telephone Encounter (Signed)
Pharmacy Patient Advocate Encounter   Received notification from Physician's Office that prior authorization for [facility] 0.25MG /0.5ML auto-injectors is required/requested.   Insurance verification completed.   The patient is insured through [employer] .   Per test claim: CANCELLED due to: Your patient will pay 100% of a discounted price for this medication. Any amount the patient pays will not apply to their deductible or out-of-pocket expenses  Ran test claim. Currently a quantity of 2 ML is a 28 day supply and the co-pay is [phone]

## 2023-07-28 ENCOUNTER — Other Ambulatory Visit: Payer: Self-pay

## 2023-07-28 ENCOUNTER — Ambulatory Visit: Payer: Commercial Managed Care - HMO | Attending: Orthopaedic Surgery

## 2023-07-28 DIAGNOSIS — M5459 Other low back pain: Secondary | ICD-10-CM | POA: Insufficient documentation

## 2023-07-28 DIAGNOSIS — M47816 Spondylosis without myelopathy or radiculopathy, lumbar region: Secondary | ICD-10-CM | POA: Diagnosis not present

## 2023-07-28 DIAGNOSIS — M6281 Muscle weakness (generalized): Secondary | ICD-10-CM | POA: Diagnosis present

## 2023-07-28 DIAGNOSIS — R2689 Other abnormalities of gait and mobility: Secondary | ICD-10-CM | POA: Insufficient documentation

## 2023-07-28 NOTE — Telephone Encounter (Signed)
Let Nina Patterson know the Nina Patterson is covered but her copay is still very high. She can look online and see if there is a coupon that can possibly lower her copay for [facility] or she can go to their website and see if she qualifies for financial assistance (based on income).

## 2023-07-28 NOTE — Therapy (Signed)
OUTPATIENT PHYSICAL THERAPY THORACOLUMBAR EVALUATION   Patient Name: Nina Patterson MRN: [id number] DOB:30-Nov-1950, 72 y.o., female Today's Date: 07/28/2023  END OF SESSION:  PT End of Session - 07/28/23 1517     Visit Number 1    Number of Visits 17    Date for PT Re-Evaluation 09/22/23    Authorization Type [employer]    PT Start Time 1400    PT Stop Time 1445    PT Time Calculation (min) 45 min    Activity Tolerance Patient tolerated treatment well    Behavior During Therapy WFL for tasks assessed/performed             Past Medical History:  Diagnosis Date   Class 2 severe obesity due to excess calories with serious comorbidity and body mass index (BMI) of 39.0 to 39.9 in adult [facility]) 08/07/2021   Diabetes mellitus without complication (HCC)    Hypertension    Hypothyroidism    Osteoarthritis    Primary hypertension 08/07/2021   History reviewed. No pertinent surgical history. 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    PCP: Dulce Sellar, NP  REFERRING PROVIDER: Eldred Manges, MD  REFERRING DIAG: [phone] (ICD-10-CM) - Facet degeneration of lumbar region   Rationale for Evaluation and Treatment: Rehabilitation  THERAPY DIAG:  Other low back pain - Plan: PT plan of care cert/re-cert  Muscle weakness (generalized) - Plan: PT plan of care cert/re-cert  Other abnormalities of gait and mobility - Plan:  PT plan of care cert/re-cert  ONSET DATE: Chronic  SUBJECTIVE:                                                                                                                                                                                           SUBJECTIVE STATEMENT: Pt presents to PT with reports of acute on chronic LBP with referral of pain and numbness into L lateral thigh. Notes pain is worse with prolonged standing and walking, has to use a shopping cart to help get around in stores. Denies bowel/bladder changes or saddle anesthesia. Has had success with PT in past, has not been  able to keep up with home exercises.   PERTINENT HISTORY:  DM II, HTN  PAIN:  Are you having pain?  No: NPRS scale: 0/10 Worst: 8/10 Pain location: lower back, L LE Pain description: sharp, numb Aggravating factors: prolonged standing, walking Relieving factors: rest, massage  PRECAUTIONS: None  RED FLAGS: None   WEIGHT BEARING RESTRICTIONS: No  FALLS:  Has patient fallen in last 6 months? 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  DIAGNOSTIC FINDINGS:  See imaging  PATIENT SURVEYS:  FOTO: deferred to next session  COGNITION: Overall cognitive status: Within functional limits for tasks assessed     SENSATION: Light touch: Impaired - lateral L LE   MUSCLE LENGTH: Hamstrings: Right WFL deg; Left WFL deg Thomas test: Right (-); Left (+)  POSTURE: rounded shoulders, forward head, and increased lumbar lordosis  PALPATION: TTP to lateral L gluteals and L thigh  LUMBAR ROM:   AROM eval  Flexion WFL - pain when raising with use of  hands  Extension WNL with no pain  Right lateral flexion   Left lateral flexion   Right rotation   Left rotation    (Blank rows = not tested)  LOWER EXTREMITY MMT:    MMT Right eval Left eval  Hip flexion 3+/5 3+/5  Hip extension    Hip abduction 3+/5 3+/5  Hip adduction 3+/5 3+/5  Hip internal rotation    Hip external rotation    Knee flexion    Knee extension    Ankle dorsiflexion    Ankle plantarflexion    Ankle inversion    Ankle eversion     (Blank rows = not tested)  LUMBAR SPECIAL TESTS:  Straight leg raise test: Positive and Slump test: Positive  FUNCTIONAL TESTS:  30 Second Sit to Stand: 9 reps   GAIT: Distance walked: 76ft Assistive device utilized: None Level of assistance: Complete Independence Comments: flexed trunk  TREATMENT: [facility] Adult PT Treatment:                                                DATE: 07/28/2023 Therapeutic Exercise: Modified thomas stretch x 30" L Supine sciatic nerve glide x 15 L Supine PPT x 5 - 5" hold  PATIENT EDUCATION:  Education details: eval findings, FOTO, HEP, POC Person educated: Patient Education method: Explanation, Demonstration, and Handouts Education comprehension: verbalized understanding and returned demonstration  HOME EXERCISE PROGRAM: Access Code: [email or web address] URL: [email or web address] Date: 07/28/2023 Prepared by: Edwinna Areola  Exercises - Modified Maisie Fus Stretch  - 1 x daily - 7 x weekly - 2 reps - 30 sec hold - Supine Sciatic Nerve Glide  - 1 x daily - 7 x weekly - 2 sets - 15 reps - Supine Posterior Pelvic Tilt  - 1 x daily - 7 x weekly - 2 sets - 10 reps - 5 sec hold  ASSESSMENT:  CLINICAL IMPRESSION: Patient is a 72 y.o. F who was seen today for physical therapy evaluation and treatment for chronic lower back and L LE pain and paresthesias. Physical findings are  consistent with MD impression as pt demonstrates decrease in core/hip strength and functional mobility. FOTO score shows  decrease in subjective functional ability below PLOF. Pt would benefit from skilled PT services working on improving strength and mobility while decreasing pain.    OBJECTIVE IMPAIRMENTS: decreased activity tolerance, decreased balance, decreased mobility, difficulty walking, decreased ROM, decreased strength, and pain  ACTIVITY LIMITATIONS: carrying, lifting, standing, squatting, stairs, transfers, and locomotion level  PARTICIPATION LIMITATIONS: driving, shopping, community activity, occupation, and yard work  PERSONAL FACTORS: Time since onset of injury/illness/exacerbation and 1-2 comorbidities: DM II, HTN  are also affecting patient's functional outcome.   REHAB POTENTIAL: Good  CLINICAL DECISION MAKING: Stable/uncomplicated  EVALUATION COMPLEXITY: Low   GOALS: Goals reviewed with patient? No  SHORT TERM GOALS: Target date: 08/18/2023   Pt will be compliant and knowledgeable with initial HEP for improved comfort and carryover Baseline: initial HEP given  Goal status: INITIAL  2.  Pt will self report low back and L LE pain no greater than 6/10 for improved comfort and functional ability Baseline: 8/10 at worst Goal status: INITIAL   LONG TERM GOALS: Target date: 09/22/2023   Pt will improve FOTO function score to no less than predicted value as proxy for functional improvement with work and home ADLs Baseline: deferred to next session Goal status: INITIAL   2.  Pt will self report low back and L LE pain no greater than 3/10 for improved comfort and functional ability Baseline: 8/10 at worst Goal status: INITIAL   3.  Pt will increase 30 Second Sit to Stand rep count to no less than 11 reps for improved balance, strength, and functional mobility Baseline: 9 reps  Goal status: INITIAL   4.  Pt will improve LE MMT to no less than 4/5 for improved functional mobility and decreased pain with work and ADLs Baseline: see MMT chart Goal status: INITIAL   PLAN:  PT  FREQUENCY: 1-2x/week  PT DURATION: 8 weeks  PLANNED INTERVENTIONS: 97164- PT Re-evaluation, 97110-Therapeutic exercises, 97530- Therapeutic activity, [id number]- Neuromuscular re-education, 97535- Self Care, 16109- Manual therapy, [id number]- Aquatic Therapy, 97014- Electrical stimulation (unattended), [id number]- Electrical stimulation (manual), Dry Needling, Cryotherapy, and Moist heat  PLAN FOR NEXT SESSION: take FOTO, assess HEP response, core/hip strengthening, hip flexor stretching   Eloy End, PT 07/28/2023, 3:19 PM

## 2023-07-29 ENCOUNTER — Encounter: Payer: Self-pay | Admitting: Physician Assistant

## 2023-07-29 ENCOUNTER — Ambulatory Visit: Payer: Managed Care, Other (non HMO) | Attending: Physician Assistant | Admitting: Physician Assistant

## 2023-07-29 VITALS — BP 138/72 | HR 71 | Ht 60.0 in | Wt 212.2 lb

## 2023-07-29 DIAGNOSIS — E1159 Type 2 diabetes mellitus with other circulatory complications: Secondary | ICD-10-CM

## 2023-07-29 DIAGNOSIS — R0609 Other forms of dyspnea: Secondary | ICD-10-CM

## 2023-07-29 DIAGNOSIS — E1169 Type 2 diabetes mellitus with other specified complication: Secondary | ICD-10-CM

## 2023-07-29 DIAGNOSIS — R5383 Other fatigue: Secondary | ICD-10-CM | POA: Diagnosis not present

## 2023-07-29 DIAGNOSIS — R600 Localized edema: Secondary | ICD-10-CM

## 2023-07-29 DIAGNOSIS — I152 Hypertension secondary to endocrine disorders: Secondary | ICD-10-CM | POA: Diagnosis not present

## 2023-07-29 DIAGNOSIS — E785 Hyperlipidemia, unspecified: Secondary | ICD-10-CM

## 2023-07-29 DIAGNOSIS — E119 Type 2 diabetes mellitus without complications: Secondary | ICD-10-CM

## 2023-07-29 DIAGNOSIS — E8881 Metabolic syndrome: Secondary | ICD-10-CM

## 2023-07-29 NOTE — Patient Instructions (Signed)
Medication Instructions:  Your physician recommends that you continue on your current medications as directed. Please refer to the Current Medication list given to you today.  *If you need a refill on your cardiac medications before your next appointment, please call your pharmacy*   Lab Work: NONE ordered at this time of appointment    Testing/Procedures: Your physician has requested that you have an echocardiogram. Echocardiography is a painless test that uses sound waves to create images of your heart. It provides your doctor with information about the size and shape of your heart and how well your heart's chambers and valves are working. This procedure takes approximately one hour. There are no restrictions for this procedure. Please do NOT wear cologne, perfume, aftershave, or lotions (deodorant is allowed). Please arrive 15 minutes prior to your appointment time.  Please note: We ask at that you not bring children with you during ultrasound (echo/ vascular) testing. Due to room size and safety concerns, children are not allowed in the ultrasound rooms during exams. Our front office staff cannot provide observation of children in our lobby area while testing is being conducted. An adult accompanying a patient to their appointment will only be allowed in the ultrasound room at the discretion of the ultrasound technician under special circumstances. We apologize for any inconvenience.   Follow-Up: At [facility], you and your health needs are our priority.  As part of our continuing mission to provide you with exceptional heart care, we have created designated Provider Care Teams.  These Care Teams include your primary Cardiologist (physician) and Advanced Practice Providers (APPs -  Physician Assistants and Nurse Practitioners) who all work together to provide you with the care you need, when you need it.  We recommend signing up for the patient portal called "MyChart".  Sign up  information is provided on this After Visit Summary.  MyChart is used to connect with patients for Virtual Visits (Telemedicine).  Patients are able to view lab/test results, encounter notes, upcoming appointments, etc.  Non-urgent messages can be sent to your provider as well.   To learn more about what you can do with MyChart, go to [email or web address].    Your next appointment:   6 week(s)  Provider:   Micah Flesher, PA-C        Other Instructions Monitor blood pressure in the evenings.

## 2023-07-30 NOTE — Telephone Encounter (Signed)
Unable to reach pt lvm requesting call back

## 2023-08-14 ENCOUNTER — Ambulatory Visit: Payer: Commercial Managed Care - HMO | Admitting: Physical Therapy

## 2023-08-26 ENCOUNTER — Encounter: Payer: Self-pay | Admitting: Family

## 2023-08-28 ENCOUNTER — Ambulatory Visit: Payer: Commercial Managed Care - HMO | Attending: Orthopaedic Surgery

## 2023-08-28 DIAGNOSIS — M5459 Other low back pain: Secondary | ICD-10-CM | POA: Diagnosis present

## 2023-08-28 DIAGNOSIS — M6281 Muscle weakness (generalized): Secondary | ICD-10-CM | POA: Diagnosis present

## 2023-08-28 DIAGNOSIS — R2689 Other abnormalities of gait and mobility: Secondary | ICD-10-CM | POA: Diagnosis present

## 2023-08-28 NOTE — Therapy (Signed)
OUTPATIENT PHYSICAL THERAPY TREATMENT   Patient Name: Nina Patterson MRN: [id number] DOB:1951-02-06, 72 y.o., female Today's Date: 08/29/2023  END OF SESSION:  PT End of Session - 08/28/23 1611     Visit Number 2    Number of Visits 17    Date for PT Re-Evaluation 09/22/23    Authorization Type [employer]    PT Start Time 1615    PT Stop Time 1655    PT Time Calculation (min) 40 min    Activity Tolerance Patient tolerated treatment well    Behavior During Therapy WFL for tasks assessed/performed              Past Medical History:  Diagnosis Date   Class 2 severe obesity due to excess calories with serious comorbidity and body mass index (BMI) of 39.0 to 39.9 in adult [facility]) 08/07/2021   Diabetes mellitus without complication (HCC)    Hypertension    Hypothyroidism    Osteoarthritis    Primary hypertension 08/07/2021   History reviewed. No pertinent surgical history. 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    PCP: Dulce Sellar, NP  REFERRING PROVIDER: Eldred Manges, MD  REFERRING DIAG: [phone] (ICD-10-CM) - Facet degeneration of lumbar region   Rationale for Evaluation and Treatment: Rehabilitation  THERAPY DIAG:  Other low back pain  Muscle weakness (generalized)  Other abnormalities of gait and mobility  ONSET DATE: Chronic  SUBJECTIVE:                                                                                                                                                                                            SUBJECTIVE STATEMENT: Video Interpreter Utilized:  Pt presents to PT with reports of slight lower back and L LE pain but otherwise is doing well. Had a cold last week and had to cancel.   PERTINENT HISTORY:  DM II, HTN  PAIN:  Are you having pain?  No: NPRS scale: 0/10 Worst: 8/10 Pain location: lower back, L LE Pain description: sharp, numb Aggravating factors: prolonged standing, walking Relieving factors: rest, massage  PRECAUTIONS: None  RED FLAGS:  None   WEIGHT BEARING RESTRICTIONS: No  FALLS:  Has patient fallen in last 6 months? 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  DIAGNOSTIC FINDINGS:  See imaging  PATIENT SURVEYS:  FOTO: deferred to next session  COGNITION: Overall cognitive status: Within functional limits for tasks assessed     SENSATION: Light touch: Impaired - lateral L LE   MUSCLE LENGTH: Hamstrings: Right WFL deg; Left WFL deg Thomas test: Right (-); Left (+)  POSTURE: rounded shoulders, forward head, and increased lumbar lordosis  PALPATION: TTP to lateral L gluteals and L thigh  LUMBAR ROM:   AROM eval  Flexion WFL - pain when raising with use of hands  Extension WNL with no pain  Right lateral flexion   Left lateral flexion   Right rotation   Left rotation    (Blank rows = not tested)  LOWER EXTREMITY MMT:    MMT Right eval Left eval  Hip flexion 3+/5 3+/5  Hip extension    Hip abduction 3+/5 3+/5  Hip adduction 3+/5 3+/5  Hip internal  rotation    Hip external rotation    Knee flexion    Knee extension    Ankle dorsiflexion    Ankle plantarflexion    Ankle inversion    Ankle eversion     (Blank rows = not tested)  LUMBAR SPECIAL TESTS:  Straight leg raise test: Positive and Slump test: Positive  FUNCTIONAL TESTS:  30 Second Sit to Stand: 9 reps   GAIT: Distance walked: 61ft Assistive device utilized: None Level of assistance: Complete Independence Comments: flexed trunk  TREATMENT: [facility] Adult PT Treatment:                                                DATE: 08/28/2023 Therapeutic Exercise: NuStep lvl 5 UE/LE x 5 min while taking subjective LAQ 2x10 4# Supine SLR with LE extended 2x10 each Seated clamshell 2x15 black band Standing hip abd/ext 2x10 each Supine sciatic nerve glide x 15 each  [facility] Adult PT Treatment:                                                DATE: 07/28/2023 Therapeutic Exercise: Modified thomas stretch x 30" L Supine sciatic nerve glide x 15 L Supine PPT x 5 - 5" hold  PATIENT EDUCATION:  Education details: continue HEP Person educated: Patient Education method: Explanation, Demonstration, and Handouts Education comprehension: verbalized understanding and returned demonstration  HOME EXERCISE PROGRAM: Access Code: [email or web address] URL: [email or web address] Date: 07/28/2023 Prepared by: Edwinna Areola  Exercises - Modified Maisie Fus Stretch  - 1 x daily - 7 x weekly - 2 reps - 30 sec hold - Supine Sciatic Nerve Glide  - 1 x daily - 7 x weekly - 2 sets - 15 reps - Supine Posterior Pelvic Tilt  - 1 x daily - 7 x weekly - 2 sets - 10 reps - 5  sec hold  ASSESSMENT:  CLINICAL IMPRESSION: Pt was able to complete all prescribed exercises with no adverse effect or increase in pain. Vitals taken throughout therapy with stable HR and SpO2 on room air. Therapy today focused on improving hip strength and functional mobility to decrease pain. Pt continues to benefit from skilled PT,  will continue per POC.   EVAL: Patient is a 72 y.o. F who was seen today for physical therapy evaluation and treatment for chronic lower back and L LE pain and paresthesias. Physical findings are consistent with MD impression as pt demonstrates decrease in core/hip strength and functional mobility. FOTO score shows decrease in subjective functional ability below PLOF. Pt would benefit from skilled PT services working on improving strength and mobility while decreasing pain.    OBJECTIVE IMPAIRMENTS: decreased activity tolerance, decreased balance, decreased mobility, difficulty walking, decreased ROM, decreased strength, and pain  ACTIVITY LIMITATIONS: carrying, lifting, standing, squatting, stairs, transfers, and locomotion level  PARTICIPATION LIMITATIONS: driving, shopping, community activity, occupation, and yard work  PERSONAL FACTORS: Time since onset of injury/illness/exacerbation and 1-2 comorbidities: DM II, HTN  are also affecting patient's functional outcome.   REHAB POTENTIAL: Good  CLINICAL DECISION MAKING: Stable/uncomplicated  EVALUATION COMPLEXITY: Low   GOALS: Goals reviewed with patient? No  SHORT TERM GOALS: Target date: 08/18/2023   Pt will be compliant and knowledgeable with initial HEP for improved comfort and carryover Baseline: initial HEP given  Goal status: INITIAL  2.  Pt will self report low back and L LE pain no greater than 6/10 for improved comfort and functional ability Baseline: 8/10 at worst Goal status: INITIAL   LONG TERM GOALS: Target date: 09/22/2023   Pt will improve FOTO function score to no less than predicted value as proxy for functional improvement with work and home ADLs Baseline: deferred to next session Goal status: INITIAL   2.  Pt will self report low back and L LE pain no greater than 3/10 for improved comfort and functional ability Baseline: 8/10 at worst Goal status: INITIAL   3.  Pt will increase 30 Second Sit to Stand rep  count to no less than 11 reps for improved balance, strength, and functional mobility Baseline: 9 reps  Goal status: INITIAL   4.  Pt will improve LE MMT to no less than 4/5 for improved functional mobility and decreased pain with work and ADLs Baseline: see MMT chart Goal status: INITIAL   PLAN:  PT FREQUENCY: 1-2x/week  PT DURATION: 8 weeks  PLANNED INTERVENTIONS: 97164- PT Re-evaluation, 97110-Therapeutic exercises, 97530- Therapeutic activity, [id number]- Neuromuscular re-education, 97535- Self Care, [id number]- Manual therapy, [id number]- Aquatic Therapy, 97014- Electrical stimulation (unattended), [id number]- Electrical stimulation (manual), Dry Needling, Cryotherapy, and Moist heat  PLAN FOR NEXT SESSION: take FOTO, assess HEP response, core/hip strengthening, hip flexor stretching   Eloy End, PT 08/29/2023, 8:06 AM

## 2023-08-29 ENCOUNTER — Ambulatory Visit (HOSPITAL_COMMUNITY): Payer: Commercial Managed Care - HMO | Attending: Physician Assistant

## 2023-08-29 DIAGNOSIS — R600 Localized edema: Secondary | ICD-10-CM

## 2023-08-29 DIAGNOSIS — R5383 Other fatigue: Secondary | ICD-10-CM | POA: Diagnosis not present

## 2023-08-29 DIAGNOSIS — R0609 Other forms of dyspnea: Secondary | ICD-10-CM | POA: Diagnosis present

## 2023-08-29 LAB — ECHOCARDIOGRAM COMPLETE
Area-P 1/2: 3.52 cm2
S' Lateral: 2.9 cm

## 2023-09-04 ENCOUNTER — Ambulatory Visit: Payer: Commercial Managed Care - HMO

## 2023-09-04 DIAGNOSIS — R2689 Other abnormalities of gait and mobility: Secondary | ICD-10-CM

## 2023-09-04 DIAGNOSIS — M6281 Muscle weakness (generalized): Secondary | ICD-10-CM

## 2023-09-04 DIAGNOSIS — M5459 Other low back pain: Secondary | ICD-10-CM

## 2023-09-04 NOTE — Therapy (Addendum)
 OUTPATIENT PHYSICAL THERAPY TREATMENT NOTE/DISCHARGE  PHYSICAL THERAPY DISCHARGE SUMMARY  Visits from Start of Care: 3  Current functional level related to goals / functional outcomes: See goals/objective   Remaining deficits: Unable to assess   Education / Equipment: HEP   Patient agrees to discharge. Patient goals were unable to assess. Patient is being discharged due to not returning since the last visit.     Patient Name: Nina Patterson MRN: [id number] DOB:[birth year], 72 y.o., female Today's Date: 09/05/2023  END OF SESSION:  PT End of Session - 09/04/23 1608     Visit Number 3    Number of Visits 17    Date for PT Re-Evaluation 09/22/23    Authorization Type [employer]    PT Start Time 1610    PT Stop Time 1658    PT Time Calculation (min) 48 min    Activity Tolerance Patient tolerated treatment well    Behavior During Therapy WFL for tasks assessed/performed               Past Medical History:  Diagnosis Date   Class 2 severe obesity due to excess calories with serious comorbidity and body mass index (BMI) of 39.0 to 39.9 in adult [facility]) 08/07/2021   Diabetes mellitus without complication (HCC)    Hypertension    Hypothyroidism    Osteoarthritis    Primary hypertension 08/07/2021   History reviewed. No pertinent surgical history. 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    PCP: Lucius Krabbe, NP  REFERRING PROVIDER: Barbarann Oneil BROCKS, MD  REFERRING DIAG: [phone] (ICD-10-CM) - Facet degeneration of lumbar region   Rationale for Evaluation and Treatment: Rehabilitation  THERAPY DIAG:  Other low back pain  Muscle weakness (generalized)  Other abnormalities of gait and mobility  ONSET DATE: Chronic  SUBJECTIVE:                                                                                                                                                                                           SUBJECTIVE STATEMENT: In Person Interpreter Utilized:  Pt presetns to PT with reports of continued LBP  with referral into R hip pain. Has been fairly compliant with HEP.   PERTINENT HISTORY:  DM II, HTN  PAIN:  Are you having pain?  No: NPRS scale: 0/10 Worst: 8/10 Pain location: lower back, L LE Pain description: sharp, numb Aggravating factors: prolonged standing, walking Relieving factors: rest, massage  PRECAUTIONS: None  RED FLAGS: None   WEIGHT BEARING RESTRICTIONS: No  FALLS:  Has patient fallen in last 6 months? 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  DIAGNOSTIC FINDINGS:  See imaging  PATIENT SURVEYS:  FOTO: deferred to next session  COGNITION: Overall cognitive status: Within functional limits for tasks assessed     SENSATION: Light touch: Impaired - lateral L LE   MUSCLE LENGTH: Hamstrings: Right WFL deg; Left WFL deg Thomas test: Right (-); Left (+)  POSTURE: rounded shoulders, forward head, and increased lumbar lordosis  PALPATION: TTP to lateral L gluteals and L  thigh  LUMBAR ROM:   AROM eval  Flexion WFL - pain when raising with use of hands  Extension WNL with no pain  Right lateral flexion   Left lateral flexion   Right rotation   Left rotation    (Blank rows = not tested)  LOWER EXTREMITY MMT:    MMT Right eval Left eval  Hip flexion 3+/5 3+/5  Hip extension    Hip abduction 3+/5 3+/5  Hip adduction 3+/5 3+/5  Hip internal rotation    Hip external rotation    Knee flexion    Knee extension    Ankle dorsiflexion    Ankle plantarflexion    Ankle inversion    Ankle eversion     (Blank rows = not tested)  LUMBAR SPECIAL TESTS:  Straight leg raise test: Positive and Slump test: Positive  FUNCTIONAL TESTS:  30 Second Sit to Stand: 9 reps   GAIT: Distance walked: 43ft Assistive device utilized: None Level of assistance: Complete Independence Comments: flexed trunk  TREATMENT: [facility] Adult PT Treatment:                                                DATE: 09/04/2023 Therapeutic Exercise: NuStep lvl 5 UE/LE x 5 min while taking subjective Seated pilates LAQ 2x10 4# Supine pilates SLR x 10 each Seated clamshell 2x15 black band Seated hamstring stretch 2x30 each Lateral walk RTB x 2 laps at counter Standing hip abd/ext RTB 2x10 each  [facility] Adult PT Treatment:                                                DATE: 08/28/2023 Therapeutic Exercise: NuStep lvl 5 UE/LE x 5 min while taking subjective LAQ 2x10 4# Supine SLR with LE extended 2x10 each Seated clamshell 2x15 black band Standing hip abd/ext 2x10 each Supine sciatic nerve glide x 15 each  [facility] Adult PT Treatment:  DATE: 07/28/2023 Therapeutic Exercise: Modified thomas stretch x 30 L Supine sciatic nerve glide x 15 L Supine PPT x 5 - 5 hold  PATIENT EDUCATION:  Education details: continue HEP Person educated: Patient Education method: Explanation, Demonstration, and Handouts Education comprehension: verbalized  understanding and returned demonstration  HOME EXERCISE PROGRAM: Access Code: [email or web address] URL: [email or web address] Date: 09/04/2023 Prepared by: Alm Kingdom  Exercises - Modified Debby Stretch  - 1 x daily - 3 x weekly - 2 reps - 30 sec hold - Supine Sciatic Nerve Glide  - 1 x daily - 3 x weekly - 2 sets - 15 reps - Supine Posterior Pelvic Tilt  - 1 x daily - 3 x weekly - 2 sets - 10 reps - 5 sec hold - Standing Hip Abduction with Resistance at Ankles and Counter Support  - 1 x daily - 3 x weekly - 2 sets - 10 reps - red band hold - Standing Hip Extension with Resistance at Ankles and Counter Support  - 3 x weekly - 2 sets - 10 reps - red band hold - Seated Hamstring Stretch  - 3 x weekly - 2 reps - 30 sec hold  ASSESSMENT:  CLINICAL IMPRESSION: Pt was able to complete all prescribed exercises with no adverse effect, noted fatigue post session. Therapy today focused on improving core and hip strength in order to decrease pain and improve comfort. HEP updated for continued LE strengthening. Will continue per POC as prescribed.   EVAL: Patient is a 72 y.o. F who was seen today for physical therapy evaluation and treatment for chronic lower back and L LE pain and paresthesias. Physical findings are consistent with MD impression as pt demonstrates decrease in core/hip strength and functional mobility. FOTO score shows decrease in subjective functional ability below PLOF. Pt would benefit from skilled PT services working on improving strength and mobility while decreasing pain.    OBJECTIVE IMPAIRMENTS: decreased activity tolerance, decreased balance, decreased mobility, difficulty walking, decreased ROM, decreased strength, and pain  ACTIVITY LIMITATIONS: carrying, lifting, standing, squatting, stairs, transfers, and locomotion level  PARTICIPATION LIMITATIONS: driving, shopping, community activity, occupation, and yard work  PERSONAL FACTORS: Time since onset of  injury/illness/exacerbation and 1-2 comorbidities: DM II, HTN are also affecting patient's functional outcome.   REHAB POTENTIAL: Good  CLINICAL DECISION MAKING: Stable/uncomplicated  EVALUATION COMPLEXITY: Low   GOALS: Goals reviewed with patient? No  SHORT TERM GOALS: Target date: 08/18/2023   Pt will be compliant and knowledgeable with initial HEP for improved comfort and carryover Baseline: initial HEP given  Goal status: INITIAL  2.  Pt will self report low back and L LE pain no greater than 6/10 for improved comfort and functional ability Baseline: 8/10 at worst Goal status: INITIAL   LONG TERM GOALS: Target date: 09/22/2023   Pt will improve FOTO function score to no less than predicted value as proxy for functional improvement with work and home ADLs Baseline: deferred to next session Goal status: INITIAL   2.  Pt will self report low back and L LE pain no greater than 3/10 for improved comfort and functional ability Baseline: 8/10 at worst Goal status: INITIAL   3.  Pt will increase 30 Second Sit to Stand rep count to no less than 11 reps for improved balance, strength, and functional mobility Baseline: 9 reps  Goal status: INITIAL   4.  Pt will improve LE MMT to no less than 4/5 for improved functional mobility and decreased pain with work and  ADLs Baseline: see MMT chart Goal status: INITIAL   PLAN:  PT FREQUENCY: 1-2x/week  PT DURATION: 8 weeks  PLANNED INTERVENTIONS: 97164- PT Re-evaluation, 97110-Therapeutic exercises, 97530- Therapeutic activity, [id number]- Neuromuscular re-education, 97535- Self Care, 02859- Manual therapy, [id number]- Aquatic Therapy, 97014- Electrical stimulation (unattended), [id number]- Electrical stimulation (manual), Dry Needling, Cryotherapy, and Moist heat  PLAN FOR NEXT SESSION: take FOTO, assess HEP response, core/hip strengthening, hip flexor stretching   Alm JAYSON Kingdom, PT 09/05/2023, 7:58 AM

## 2023-09-10 ENCOUNTER — Ambulatory Visit: Payer: Managed Care, Other (non HMO) | Admitting: Physician Assistant

## 2023-09-11 ENCOUNTER — Ambulatory Visit: Payer: Commercial Managed Care - HMO

## 2023-09-23 ENCOUNTER — Telehealth: Payer: Self-pay

## 2023-09-23 NOTE — Telephone Encounter (Signed)
 Called pts daughter with [employer], [employer]. VM was left to discuss cardiac ultrasound. Waiting on a return call.

## 2023-10-02 ENCOUNTER — Ambulatory Visit: Payer: Commercial Managed Care - HMO

## 2023-11-18 ENCOUNTER — Other Ambulatory Visit: Payer: Self-pay | Admitting: Family

## 2023-11-18 DIAGNOSIS — R7303 Prediabetes: Secondary | ICD-10-CM

## 2023-11-18 DIAGNOSIS — I152 Hypertension secondary to endocrine disorders: Secondary | ICD-10-CM

## 2023-11-18 DIAGNOSIS — E039 Hypothyroidism, unspecified: Secondary | ICD-10-CM

## 2023-11-18 MED ORDER — LEVOTHYROXINE SODIUM 75 MCG PO TABS: 75.0000 ug | ORAL_TABLET | Freq: Every day | ORAL | 1 refills | Status: DC

## 2023-11-18 MED ORDER — LOSARTAN POTASSIUM-HCTZ 50-12.5 MG PO TABS: 1.0000 | ORAL_TABLET | Freq: Every day | ORAL | 1 refills | Status: DC

## 2023-11-18 MED ORDER — METFORMIN HCL ER 500 MG PO TB24: 500.0000 mg | ORAL_TABLET | Freq: Two times a day (BID) | ORAL | 1 refills | Status: DC

## 2023-11-18 NOTE — Telephone Encounter (Signed)
 Last Fill: Hyzaar: 05/14/23     Metformin: 07/23/23     Levothyroxine: 05/14/23  Last OV: 07/23/23 Next OV: None Scheduled  Routing to provider for review/authorization.

## 2023-11-18 NOTE — Telephone Encounter (Signed)
 Copied from CRM [id number]. Topic: Clinical - Medication Refill >> Nov 18, 2023 11:42 AM Pascal Lux wrote: Most Recent Primary Care Visit:  Provider: Dulce Sellar  Department: [facility]  Visit Type: OFFICE VISIT  Date: 07/23/2023  Medication: losartan-hydrochlorothiazide (HYZAAR) 50-12.5 MG tablet [[id number]] metFORMIN (GLUCOPHAGE-XR) 500 MG 24 hr tablet [[id number]] levothyroxine (SYNTHROID) 75 MCG tablet [[id number]]  Has the patient contacted their pharmacy? No (Agent: If no, request that the patient contact the pharmacy for the refill. If patient does not wish to contact the pharmacy document the reason why and proceed with request.) (Agent: If yes, when and what did the pharmacy advise?)  Is this the correct pharmacy for this prescription? Yes If no, delete pharmacy and type the correct one.  This is the patient's preferred pharmacy:  [facility] [address] [place] - [address] [place] [address] Phone: [phone] Fax: [phone]   Has the prescription been filled recently? No  Is the patient out of the medication? Yes  Has the patient been seen for an appointment in the last year OR does the patient have an upcoming appointment? Yes  Can we respond through MyChart? No  Agent: Please be advised that Rx refills may take up to 3 business days. We ask that you follow-up with your pharmacy.

## 2023-12-04 ENCOUNTER — Ambulatory Visit: Payer: Commercial Managed Care - HMO | Admitting: Family

## 2023-12-04 ENCOUNTER — Encounter: Payer: Self-pay | Admitting: Physician Assistant

## 2023-12-04 ENCOUNTER — Encounter: Payer: Self-pay | Admitting: Family

## 2023-12-04 VITALS — BP 130/73 | HR 86 | Temp 97.8°F | Ht 60.0 in | Wt 216.2 lb

## 2023-12-04 DIAGNOSIS — F411 Generalized anxiety disorder: Secondary | ICD-10-CM

## 2023-12-04 DIAGNOSIS — E1169 Type 2 diabetes mellitus with other specified complication: Secondary | ICD-10-CM | POA: Diagnosis not present

## 2023-12-04 DIAGNOSIS — I152 Hypertension secondary to endocrine disorders: Secondary | ICD-10-CM

## 2023-12-04 DIAGNOSIS — Z7984 Long term (current) use of oral hypoglycemic drugs: Secondary | ICD-10-CM

## 2023-12-04 DIAGNOSIS — E785 Hyperlipidemia, unspecified: Secondary | ICD-10-CM | POA: Diagnosis not present

## 2023-12-04 DIAGNOSIS — R7303 Prediabetes: Secondary | ICD-10-CM

## 2023-12-04 MED ORDER — ATORVASTATIN CALCIUM 10 MG PO TABS: 10.0000 mg | ORAL_TABLET | Freq: Every day | ORAL | 1 refills | Status: DC

## 2023-12-04 NOTE — Patient Instructions (Signed)
 It was very nice to see you today!   I will review your lab results via MyChart in a few days.   Have a great rest of the week!   PLEASE NOTE:  If you had any lab tests please let us know if you have not heard back within a few days. You may see your results on MyChart before we have a chance to review them but we will give you a call once they are reviewed by [place]. If we ordered any referrals today, please let us know if you have not heard from their office within the next week.

## 2023-12-04 NOTE — Progress Notes (Unsigned)
 Patient ID: Nina Patterson, female    DOB: [birth year], 73 y.o.   MRN: [id number]  Chief Complaint  Patient presents with  . Type 2 diabetes mellitus without complication, without long         Assessment & Plan:   Subjective:    Outpatient Medications Prior to Visit  Medication Sig Dispense Refill  . acetaminophen (TYLENOL) 650 MG CR tablet Take 650 mg by mouth every 8 (eight) hours as needed for pain.    . busPIRone (BUSPAR) 5 MG tablet Take 1 tablet (5 mg total) by mouth 2 (two) times daily. For Anxiety. 60 tablet 2  . cholecalciferol (VITAMIN D3) 25 MCG (1000 UNIT) tablet Take 1,000 Units by mouth daily.    . cyclobenzaprine (FLEXERIL) 5 MG tablet Take 1-2 tablets (5-10 mg total) by mouth 3 (three) times daily as needed (For leg pain. May cause drowsiness and can take 2 pills at bedtime.). 60 tablet 2  . diphenoxylate-atropine (LOMOTIL) 2.5-0.025 MG tablet Take 1 tablet by mouth 4 (four) times daily as needed for diarrhea or loose stools (Max dose of 8 tabs in 24 hours). 30 tablet 0  . EQ ASPIRIN ADULT LOW DOSE 81 MG EC tablet Take 81 mg by mouth daily.    Marland Kitchen FOLIC ACID PO Take by mouth.    . levothyroxine (SYNTHROID) 75 MCG tablet Take 1 tablet (75 mcg total) by mouth daily. 90 tablet 1  . losartan-hydrochlorothiazide (HYZAAR) 50-12.5 MG tablet Take 1 tablet by mouth daily. 90 tablet 1  . metFORMIN (GLUCOPHAGE-XR) 500 MG 24 hr tablet Take 1 tablet (500 mg total) by mouth 2 (two) times daily with a meal. 180 tablet 1  . Omega-3 Fatty Acids (FISH OIL) 1000 MG CPDR Take 1 tablet by mouth in the morning, at noon, and at bedtime.    . ondansetron (ZOFRAN-ODT) 4 MG disintegrating tablet Take 1 tablet (4 mg total) by mouth every 8 (eight) hours as needed for nausea or vomiting. 20 tablet 0  . atorvastatin (LIPITOR) 10 MG tablet Take 1 tablet (10 mg total) by mouth at bedtime. (Patient not taking: Reported on 12/04/2023) 90 tablet 1  . Semaglutide-Weight Management (WEGOVY) 0.25  MG/0.5ML SOAJ Inject 0.25 mg into the skin once a week. (Patient not taking: Reported on 12/04/2023) 2 mL 1   No facility-administered medications prior to visit.   Past Medical History:  Diagnosis Date  . Class 2 severe obesity due to excess calories with serious comorbidity and body mass index (BMI) of 39.0 to 39.9 in adult [facility]) 08/07/2021  . Diabetes mellitus without complication (HCC)   . Hypertension   . Hypothyroidism   . Osteoarthritis   . Primary hypertension 08/07/2021   History reviewed. No pertinent surgical history. Allergies  Allergen Reactions  . Semaglutide Itching and Rash      Objective:    Physical Exam Vitals and nursing note reviewed.  Constitutional:      Appearance: Normal appearance.  Cardiovascular:     Rate and Rhythm: Normal rate and regular rhythm.  Pulmonary:     Effort: Pulmonary effort is normal.     Breath sounds: Normal breath sounds.  Musculoskeletal:        General: Normal range of motion.  Skin:    General: Skin is warm and dry.  Neurological:     Mental Status: She is alert.  Psychiatric:        Mood and Affect: Mood normal.        Behavior:  Behavior normal.   BP 130/73 (BP Location: Left Arm, Patient Position: Sitting, Cuff Size: Large)   Pulse 86   Temp 97.8 F (36.6 C) (Temporal)   Ht 5' (1.524 m)   Wt 216 lb 3.2 oz (98.1 kg)   SpO2 96%   BMI 42.22 kg/m  Wt Readings from Last 3 Encounters:  12/04/23 216 lb 3.2 oz (98.1 kg)  07/29/23 212 lb 3.2 oz (96.3 kg)  07/23/23 213 lb 2 oz (96.7 kg)      Dulce Sellar, NP

## 2023-12-05 MED ORDER — BUSPIRONE HCL 5 MG PO TABS: 5.0000 mg | ORAL_TABLET | Freq: Two times a day (BID) | ORAL | 5 refills | Status: AC

## 2023-12-05 NOTE — Assessment & Plan Note (Signed)
 chronic taking Lipitor 20mg  daily sending refill labs checked 05/2023 wnl f/u 6-69m month

## 2023-12-05 NOTE — Assessment & Plan Note (Signed)
 Explained metformin's role in glucose management and helping to maintain weight. Suggested dosage adjustment to reduce side effects and aid weight maintenance. - Take metformin 500mg  1 pill twice daily vs. 2 pills at one time. - Monitor for gastrointestinal side effects. -F/U 6 mos or prn

## 2023-12-05 NOTE — Assessment & Plan Note (Signed)
 chronic, stable doing well on Buspar 5mg  bid sending refill for Buspar 5mg   f/u 6 mos or prn

## 2023-12-05 NOTE — Assessment & Plan Note (Signed)
 Shortness of breath linked to obesity. Discussed weight loss options and cost-effective medication alternatives. Advised on herbal supplement interactions. Pt insurance is not covering any meds for weight loss. Advised on lifestyle modifications, exercise, hydration, and dietary changes. Suggested nutritionist consultation. - Consider referral to weight loss clinic or Nutritionist, pt working and concerned if she has time to go. - Advised to monitor blood pressure if using herbal supplements. - Discuss cost and options for weight loss medications online.

## 2023-12-08 ENCOUNTER — Other Ambulatory Visit: Payer: Self-pay | Admitting: Family

## 2023-12-08 DIAGNOSIS — E039 Hypothyroidism, unspecified: Secondary | ICD-10-CM

## 2023-12-08 NOTE — Telephone Encounter (Signed)
 Copied from CRM [id number]. Topic: Clinical - Medication Refill >> Dec 08, 2023  9:52 AM Marica Otter wrote: Most Recent Primary Care Visit:  Provider: Dulce Sellar  Department: [facility]  Visit Type: OFFICE VISIT  Date: 12/04/2023  Medication: levothyroxine (SYNTHROID) 75 MCG tablet  Has the patient contacted their pharmacy? Yes, no refill on file (Agent: If no, request that the patient contact the pharmacy for the refill. If patient does not wish to contact the pharmacy document the reason why and proceed with request.) (Agent: If yes, when and what did the pharmacy advise?)  Is this the correct pharmacy for this prescription? Yes If no, delete pharmacy and type the correct one.  This is the patient's preferred pharmacy:  [facility] [address] [place] - [address] [place] [address] Phone: [phone] Fax: [phone]   Has the prescription been filled recently? No  Is the patient out of the medication? Yes  Has the patient been seen for an appointment in the last year OR does the patient have an upcoming appointment? Yes  Can we respond through MyChart? No  Agent: Please be advised that Rx refills may take up to 3 business days. We ask that you follow-up with your pharmacy.

## 2023-12-09 MED ORDER — LEVOTHYROXINE SODIUM 75 MCG PO TABS: 75.0000 ug | ORAL_TABLET | Freq: Every day | ORAL | 1 refills | Status: DC

## 2023-12-10 ENCOUNTER — Telehealth: Payer: Self-pay

## 2023-12-10 NOTE — Telephone Encounter (Signed)
 Copied from CRM [id number]. Topic: Clinical - Prescription Issue >> Dec 10, 2023  3:21 PM [place] wrote: Reason for CRM: Patient has been waiting for the levothyroxine (SYNTHROID) 75 MCG tablet to be called into the pharmacy, she said she has not had it for a week now   I let pt know refill was sent. Pt verbalized understanding.

## 2024-01-27 ENCOUNTER — Encounter: Payer: Self-pay | Admitting: Physician Assistant

## 2024-01-27 ENCOUNTER — Ambulatory Visit (INDEPENDENT_AMBULATORY_CARE_PROVIDER_SITE_OTHER): Payer: Medicare (Managed Care) | Admitting: Physician Assistant

## 2024-01-27 VITALS — BP 124/60 | HR 82 | Ht 60.0 in | Wt 214.6 lb

## 2024-01-27 DIAGNOSIS — Z1211 Encounter for screening for malignant neoplasm of colon: Secondary | ICD-10-CM

## 2024-01-27 MED ORDER — NA SULFATE-K SULFATE-MG SULF 17.5-3.13-1.6 GM/177ML PO SOLN: 1.0000 | Freq: Once | ORAL | 0 refills | Status: AC

## 2024-01-27 NOTE — Progress Notes (Signed)
 Brigitte Canard, PA-C [address] [place], [place]  [address] Phone: [phone]   Gastroenterology Consultation  Referring Provider:     Versa Gore, NP Primary Care Physician:  Versa Gore, NP Primary Gastroenterologist:  Brigitte Canard, PA-C / Dr. Laurell Pond  Reason for Consultation:     Discuss colonoscopy        HPI:   Nina Patterson is a 73 y.o. y/o female referred for consultation & management  by Versa Gore, NP.  She is here to discuss scheduling a screening Colonoscopy.  She has no family history of Colon Cancer.  We are using Spanish interpreter today from [facility] language services.  Previous colonoscopy: 14 or 15 years ago in [place], results not available.  She was told she had 1 benign polyp removed.  Patient states she had a Negative Cologuard test 4 - 5 years ago through her PCP.  GI symptoms: None.  She denies abdominal pain, constipation, or rectal bleeding.  She denies any GI symptoms.  PMH: Diabetes, HTN, hypothyroidism, echo 08/2023 normal LVEF 60 to 65%.  Takes aspirin daily.  No other blood thinners.  She has no concerns today.  Past Medical History:  Diagnosis Date   Class 2 severe obesity due to excess calories with serious comorbidity and body mass index (BMI) of 39.0 to 39.9 in adult [facility]) 08/07/2021   Diabetes mellitus without complication (HCC)    Hypertension    Hypothyroidism    Osteoarthritis    Primary hypertension 08/07/2021    History reviewed. No pertinent surgical history.  Prior to Admission medications   Medication Sig Start Date End Date Taking? Authorizing Provider  acetaminophen (TYLENOL) 650 MG CR tablet Take 650 mg by mouth every 8 (eight) hours as needed for pain.    [provider]  atorvastatin  (LIPITOR) 10 MG tablet Take 1 tablet (10 mg total) by mouth at bedtime. 12/04/23   Versa Gore, NP  busPIRone  (BUSPAR ) 5 MG tablet Take 1 tablet (5 mg total) by mouth 2 (two) times  daily. For Anxiety. 12/05/23   Versa Gore, NP  cholecalciferol (VITAMIN D3) 25 MCG (1000 UNIT) tablet Take 1,000 Units by mouth daily.    [provider]  cyclobenzaprine  (FLEXERIL ) 5 MG tablet Take 1-2 tablets (5-10 mg total) by mouth 3 (three) times daily as needed (For leg pain. May cause drowsiness and can take 2 pills at bedtime.). 06/25/23   Versa Gore, NP  EQ ASPIRIN ADULT LOW DOSE 81 MG EC tablet Take 81 mg by mouth daily. 04/09/21   [provider]  FOLIC ACID PO Take by mouth.    [provider]  levothyroxine  (SYNTHROID ) 75 MCG tablet Take 1 tablet (75 mcg total) by mouth daily. 12/09/23   Versa Gore, NP  losartan -hydrochlorothiazide (HYZAAR) 50-12.5 MG tablet Take 1 tablet by mouth daily. 11/18/23   Versa Gore, NP  metFORMIN  (GLUCOPHAGE -XR) 500 MG 24 hr tablet Take 1 tablet (500 mg total) by mouth 2 (two) times daily with a meal. 11/18/23   Versa Gore, NP  Omega-3 Fatty Acids (FISH OIL) 1000 MG CPDR Take 1 tablet by mouth in the morning, at noon, and at bedtime.    [provider]    History reviewed. No pertinent family history.   Social History   Tobacco Use   Smoking status: Never   Smokeless tobacco: Never  Substance Use Topics   Alcohol use: Not Currently    Allergies as of 01/27/2024 - Review  Complete 01/27/2024  Allergen Reaction Noted   Semaglutide  Itching and Rash 12/26/2021    Review of Systems:    All systems reviewed and negative except where noted in HPI.   Physical Exam:  BP 124/60   Pulse 82   Ht 5' (1.524 m)   Wt 214 lb 9.6 oz (97.3 kg)   BMI 41.91 kg/m  No LMP recorded. Patient is perimenopausal.  General:   Alert,  Well-developed, well-nourished, pleasant and cooperative in NAD Lungs:  Respirations even and unlabored.  Clear throughout to auscultation.   No wheezes, crackles, or rhonchi. No acute distress. Heart:  Regular rate and rhythm; no murmurs, clicks, rubs, or  gallops. Abdomen:  Normal bowel sounds.  No bruits.  Soft, and non-distended without masses, hepatosplenomegaly or hernias noted.  No Tenderness.  No guarding or rebound tenderness.    Neurologic:  Alert and oriented x3;  grossly normal neurologically. Psych:  Alert and cooperative. Normal mood and affect.  Imaging Studies: No results found.  Labs: CBC No results found for: "WBC", "RBC", "HGB", "HCT", "PLT", "MCV", "MCH", "MCHC", "RDW", "LYMPHSABS", "MONOABS", "EOSABS", "BASOSABS"  CMP     Component Value Date/Time   NA 137 06/11/2023 1031   K 4.4 06/11/2023 1031   CL 102 06/11/2023 1031   CO2 28 06/11/2023 1031   GLUCOSE 90 06/11/2023 1031   BUN 22 06/11/2023 1031   CREATININE 1.06 06/11/2023 1031   CALCIUM  9.7 06/11/2023 1031   PROT 7.5 06/11/2023 1031   ALBUMIN 4.1 06/11/2023 1031   AST 23 06/11/2023 1031   ALT 15 06/11/2023 1031   ALKPHOS 80 06/11/2023 1031   BILITOT 0.2 06/11/2023 1031    Assessment and Plan:   Nina Patterson is a 73 y.o. y/o female has been referred for:  1.  Colon cancer screening - Scheduling Colonoscopy I discussed risks of colonoscopy with patient to include risk of bleeding, colon perforation, and risk of sedation.  Patient expressed understanding and agrees to proceed with colonoscopy.   Follow up As Needed.  Brigitte Canard, PA-C

## 2024-01-27 NOTE — Patient Instructions (Signed)
 You have been scheduled for a colonoscopy. Please follow written instructions given to you at your visit today.   If you use inhalers (even only as needed), please bring them with you on the day of your procedure.  DO NOT TAKE 7 DAYS PRIOR TO TEST- Trulicity  (dulaglutide ) Ozempic , Wegovy  (semaglutide ) Mounjaro (tirzepatide) Bydureon Bcise (exanatide extended release)  DO NOT TAKE 1 DAY PRIOR TO YOUR TEST Rybelsus (semaglutide ) Adlyxin (lixisenatide) Victoza (liraglutide) Byetta (exanatide) ___________________________________________________________________________  Please follow up sooner if symptoms increase or worsen   Due to recent changes in healthcare laws, you may see the results of your imaging and laboratory studies on MyChart before your provider has had a chance to review them.  We understand that in some cases there may be results that are confusing or concerning to you. Not all laboratory results come back in the same time frame and the provider may be waiting for multiple results in order to interpret others.  Please give us  48 hours in order for your provider to thoroughly review all the results before contacting the office for clarification of your results.   _______________________________________________________  If your blood pressure at your visit was 140/90 or greater, please contact your primary care physician to follow up on this.  _______________________________________________________  If you are age 11 or older, your body mass index should be between 23-30. Your Body mass index is 41.91 kg/m. If this is out of the aforementioned range listed, please consider follow up with your Primary Care Provider.  If you are age 99 or younger, your body mass index should be between 19-25. Your Body mass index is 41.91 kg/m. If this is out of the aformentioned range listed, please consider follow up with your Primary Care Provider.    ________________________________________________________  The [facility] providers would like to encourage you to use MYCHART to communicate with providers for non-urgent requests or questions.  Due to long hold times on the telephone, sending your provider a message by [facility] may be a faster and more efficient way to get a response.  Please allow 48 business hours for a response.  Please remember that this is for non-urgent requests.  _______________________________________________________ Thank you for trusting me with your gastrointestinal care!   Brigitte Canard, PA

## 2024-02-02 NOTE — Progress Notes (Signed)
 Addendum: Reviewed and agree with assessment and management plan. Asha Grumbine, Carie Caddy, MD

## 2024-02-04 ENCOUNTER — Encounter: Payer: Medicare (Managed Care) | Admitting: Internal Medicine

## 2024-05-23 ENCOUNTER — Other Ambulatory Visit: Payer: Self-pay | Admitting: Family

## 2024-05-23 DIAGNOSIS — I152 Hypertension secondary to endocrine disorders: Secondary | ICD-10-CM

## 2024-06-09 ENCOUNTER — Encounter: Payer: Self-pay | Admitting: Family

## 2024-06-09 ENCOUNTER — Ambulatory Visit: Admitting: Family

## 2024-06-09 VITALS — BP 136/74 | HR 74 | Temp 96.4°F | Ht 60.0 in | Wt 213.4 lb

## 2024-06-09 DIAGNOSIS — E039 Hypothyroidism, unspecified: Secondary | ICD-10-CM

## 2024-06-09 DIAGNOSIS — E1169 Type 2 diabetes mellitus with other specified complication: Secondary | ICD-10-CM | POA: Diagnosis not present

## 2024-06-09 DIAGNOSIS — E1159 Type 2 diabetes mellitus with other circulatory complications: Secondary | ICD-10-CM | POA: Diagnosis not present

## 2024-06-09 DIAGNOSIS — R7303 Prediabetes: Secondary | ICD-10-CM

## 2024-06-09 DIAGNOSIS — E785 Hyperlipidemia, unspecified: Secondary | ICD-10-CM | POA: Diagnosis not present

## 2024-06-09 DIAGNOSIS — Z6841 Body Mass Index (BMI) 40.0 and over, adult: Secondary | ICD-10-CM

## 2024-06-09 DIAGNOSIS — R200 Anesthesia of skin: Secondary | ICD-10-CM

## 2024-06-09 DIAGNOSIS — M5416 Radiculopathy, lumbar region: Secondary | ICD-10-CM

## 2024-06-09 DIAGNOSIS — I152 Hypertension secondary to endocrine disorders: Secondary | ICD-10-CM

## 2024-06-09 MED ORDER — LOSARTAN POTASSIUM-HCTZ 50-12.5 MG PO TABS: 1.0000 | ORAL_TABLET | Freq: Every day | ORAL | 0 refills | Status: DC

## 2024-06-09 MED ORDER — WEGOVY 0.25 MG/0.5ML ~~LOC~~ SOAJ: 0.2500 mg | SUBCUTANEOUS | 1 refills | Status: DC

## 2024-06-09 NOTE — Assessment & Plan Note (Signed)
 Weight-related mobility issues and edema persist despite some weight loss. Interested in pharmacological intervention for weight loss. - Submit request for Wegovy  for weight loss. - Write a letter recommending work limitation to five hours per day for five days a week.

## 2024-06-09 NOTE — Patient Instructions (Addendum)
  Fue un placer verte hoy!  Revisar tus resultados de laboratorio a Games developer [employer]. Podemos hablar sobre si necesitas reiniciar Lipitor (colesterol) y Metformina (diabetes) despus de [employer]. Te envi ciclobenzaprina para el dolor de pierna. Busca crema de capsaicina sin receta (crema de aruba picante) para usar en la pierna; ayuda con el dolor, el hormigueo y Merchant navy officer. Llama al consultorio de gastroenterologa y pdeles que te enven tu medicamento preferido para tu colonoscopia.  NOTA:  Si te hiciste alguna prueba de laboratorio, avsanos si no has recibido [employer]. Es posible que veas tus resultados en MyChart antes de que podamos revisarlos, Biomedical engineer te llamaremos una vez que los [place]. Si solicitamos alguna derivacin hoy, avsanos si no has recibido noticias de su consultorio en la prxima semana.

## 2024-06-09 NOTE — Assessment & Plan Note (Signed)
 chronic, stable taking Losartan -HCTZ 50-12.5mg  daily BP in good range sending refill last GFR 05/2023 - 52, recheck CMP today f/u 6 month

## 2024-06-09 NOTE — Assessment & Plan Note (Signed)
 Normal cholesterol levels last year while taking Lipitor 20mg , has since discontinued and dietary habits require evaluation. - Order cholesterol tests today. - Discussed dietary modifications, including switching to low-fat or non-fat milk alternatives. - F/U in 1 year

## 2024-06-09 NOTE — Assessment & Plan Note (Signed)
 Eating diabetic diet. Discontinued metformin  with no hyperglycemia symptoms. Weight loss noted. - Order A1c test to assess glycemic control.  - Will restart Metformin  based on A1C result. - Continue low carb, no sweets diet, increase cardio exercise. - F/U 6 mos

## 2024-06-09 NOTE — Progress Notes (Signed)
 Patient ID: Nina Patterson, female    DOB: 02-Jul-1951, 73 y.o.   MRN: [id number]  Chief Complaint  Patient presents with   Diabetes    6 month f/u    Hypertension  Discussed the use of AI scribe software for clinical note transcription with the patient, who gave verbal consent to proceed.  History of Present Illness   Nina Patterson is a 73 year old female who presents for a follow-up visit to discuss weight management and medication coverage issues.  Weight management and dietary habits - Ten-pound weight loss over the past six months - Continues to aim for further weight loss to improve energy and mobility - Diet includes eggs, avocado, chicken soup with vegetables - Avoids red meat and ice cream - Occasionally consumes toast and soda crackers - Drinks coffee with Splenda and powdered milk; considering switching to lower-fat milk - Enjoys water with lemon and bicarbonate  Fatigue and decreased energy - Persistent fatigue impacting mobility and ability to stand for extended periods - Decreased energy as the day progresses - Suspects thyroid  dysfunction may contribute to fatigue  Peripheral edema - Ankle swelling persists, affecting mobility and ability to stand for long periods  Thyroid  dysfunction - History of thyroid  issues - Suspects thyroid  dysfunction may contribute to ongoing fatigue  Diabetes mellitus management - Has not taken metformin  recently - Awaiting laboratory results to assess A1c levels  Medication and insurance coverage - Recently changed insurance from [employer] to [employer] plan due to medication coverage issues - Concerned about coverage for weight loss medications  Preventive health maintenance - Due for mammography - Four pending vaccines, including influenza and shingles     Assessment and Plan    Obesity with associated lower extremity edema and fatigue Weight-related mobility issues and edema persist despite some  weight loss. Interested in pharmacological intervention for weight loss and has new insurance and states they cover meds for weight loss. - Submit request for Wegovy  for weight loss. - Write a letter recommending work limitation to five hours per day for five days a week.  Type 2 diabetes mellitus, not currently on medication Eating diabetic diet. Discontinued metformin  with no hyperglycemia symptoms. Weight loss noted. - Order A1c test to assess glycemic control.  - Will restart Metformin  based on A1C result. - Continue low carb, no sweets diet, increase cardio exercise. - F/U 6 mos  Hypertension  chronic, stable taking Losartan -HCTZ 50-12.5mg  daily BP in good range sending refill last GFR 05/2023 - 52, recheck CMP today f/u 6 month  Hyperlipidemia Normal cholesterol levels last year while taking Lipitor 20mg , has since discontinued and dietary habits require evaluation. - Order cholesterol tests today. - Discussed dietary modifications, including switching to low-fat or non-fat milk alternatives. - F/U in 1 year  Thyroid  disorder, unspecified Fatigue and decreased energy levels possibly related to thyroid  function.  - Continue taking Levothyroxine  75mg  qd.  - Order thyroid  function tests today. - F/U in 1 year  General Health Maintenance Pending vaccinations preferred through primary care. COVID-19 vaccine to be obtained externally. - Advised to obtain COVID-19 vaccine at a pharmacy.     Subjective:    Outpatient Medications Prior to Visit  Medication Sig Dispense Refill   acetaminophen (TYLENOL) 650 MG CR tablet Take 650 mg by mouth every 8 (eight) hours as needed for pain.     Berberine Chloride (BERBERINE HCI PO) Take by mouth.     capsaicin (ZOSTRIX) 0.025 % cream Apply topically.  cholecalciferol (VITAMIN D3) 25 MCG (1000 UNIT) tablet Take 1,000 Units by mouth daily.     cyclobenzaprine  (FLEXERIL ) 5 MG tablet Take 1-2 tablets (5-10 mg total) by mouth 3 (three)  times daily as needed (For leg pain. May cause drowsiness and can take 2 pills at bedtime.). 60 tablet 2   EQ ASPIRIN ADULT LOW DOSE 81 MG EC tablet Take 81 mg by mouth daily.     FOLIC ACID PO Take by mouth.     levothyroxine  (SYNTHROID ) 75 MCG tablet Take 1 tablet (75 mcg total) by mouth daily. 90 tablet 1   magnesium gluconate (MAGONATE) 500 (27 Mg) MG TABS tablet Take 500 mg by mouth in the morning and at bedtime.     Oil of Oregano 1500 MG CAPS Take 1 capsule by mouth daily at 6 (six) AM.     Omega-3 Fatty Acids (FISH OIL) 1000 MG CPDR Take 1 tablet by mouth in the morning, at noon, and at bedtime.     vitamin E 1000 UNIT capsule Take 1,000 Units by mouth daily.     losartan -hydrochlorothiazide (HYZAAR) 50-12.5 MG tablet Take 1 tablet by mouth once daily 90 tablet 0   atorvastatin  (LIPITOR) 10 MG tablet Take 1 tablet (10 mg total) by mouth at bedtime. (Patient not taking: Reported on 06/09/2024) 90 tablet 1   busPIRone  (BUSPAR ) 5 MG tablet Take 1 tablet (5 mg total) by mouth 2 (two) times daily. For Anxiety. (Patient not taking: Reported on 06/09/2024) 60 tablet 5   metFORMIN  (GLUCOPHAGE -XR) 500 MG 24 hr tablet Take 1 tablet (500 mg total) by mouth 2 (two) times daily with a meal. (Patient not taking: Reported on 06/09/2024) 180 tablet 1   No facility-administered medications prior to visit.   Past Medical History:  Diagnosis Date   Class 2 severe obesity due to excess calories with serious comorbidity and body mass index (BMI) of 39.0 to 39.9 in adult [facility]) 08/07/2021   Diabetes mellitus without complication (HCC)    Hypertension    Hypothyroidism    Osteoarthritis    Primary hypertension 08/07/2021   No past surgical history on file. Allergies  Allergen Reactions   Semaglutide  Itching and Rash      Objective:    Physical Exam Vitals and nursing note reviewed.  Constitutional:      Appearance: Normal appearance. She is obese.  Cardiovascular:     Rate and Rhythm: Normal rate  and regular rhythm.  Pulmonary:     Effort: Pulmonary effort is normal.     Breath sounds: Normal breath sounds.  Musculoskeletal:        General: Normal range of motion.  Skin:    General: Skin is warm and dry.  Neurological:     Mental Status: She is alert.  Psychiatric:        Mood and Affect: Mood normal.        Behavior: Behavior normal.    BP 136/74 (BP Location: Left Arm, Patient Position: Sitting, Cuff Size: Large)   Pulse 74   Temp (!) 96.4 F (35.8 C) (Temporal)   Ht 5' (1.524 m)   Wt 213 lb 6 oz (96.8 kg)   SpO2 98%   BMI 41.67 kg/m  Wt Readings from Last 3 Encounters:  06/09/24 213 lb 6 oz (96.8 kg)  01/27/24 214 lb 9.6 oz (97.3 kg)  12/04/23 216 lb 3.2 oz (98.1 kg)      Lucius Krabbe, NP

## 2024-06-09 NOTE — Assessment & Plan Note (Signed)
 Fatigue and decreased energy levels possibly related to thyroid  function.  - Continue taking Levothyroxine  75mg  qd.  - Order thyroid  function tests today. - F/U in 1 year

## 2024-06-10 LAB — LDL CHOLESTEROL, DIRECT: Direct LDL: 84 mg/dL

## 2024-06-10 LAB — COMPREHENSIVE METABOLIC PANEL WITH GFR
ALT: 16 U/L (ref 0–35)
AST: 19 U/L (ref 0–37)
Albumin: 3.9 g/dL (ref 3.5–5.2)
Alkaline Phosphatase: 91 U/L (ref 39–117)
BUN: 23 mg/dL (ref 6–23)
CO2: 30 meq/L (ref 19–32)
Calcium: 9.5 mg/dL (ref 8.4–10.5)
Chloride: 99 meq/L (ref 96–112)
Creatinine, Ser: 0.94 mg/dL (ref 0.40–1.20)
GFR: 60.03 mL/min (ref >60.00)
Glucose, Bld: 94 mg/dL (ref 70–99)
Potassium: 4.3 meq/L (ref 3.5–5.1)
Sodium: 137 meq/L (ref 135–145)
Total Bilirubin: 0.2 mg/dL (ref 0.2–1.2)
Total Protein: 7.5 g/dL (ref 6.0–8.3)

## 2024-06-10 LAB — HEMOGLOBIN A1C: Hgb A1c MFr Bld: 6.4 % (ref 4.6–6.5)

## 2024-06-10 LAB — LIPID PANEL
Cholesterol: 144 mg/dL (ref 0–200)
HDL: 34.4 mg/dL — ABNORMAL LOW (ref >39.00)
NonHDL: 109.34
Total CHOL/HDL Ratio: 4
Triglycerides: 476 mg/dL — ABNORMAL HIGH (ref 0.0–149.0)
VLDL: 95.2 mg/dL — ABNORMAL HIGH (ref 0.0–40.0)

## 2024-06-10 LAB — TSH: TSH: 2.97 u[IU]/mL (ref 0.35–5.50)

## 2024-06-13 ENCOUNTER — Ambulatory Visit: Payer: Self-pay | Admitting: Family

## 2024-06-13 DIAGNOSIS — R7303 Prediabetes: Secondary | ICD-10-CM

## 2024-06-13 DIAGNOSIS — E1169 Type 2 diabetes mellitus with other specified complication: Secondary | ICD-10-CM

## 2024-06-13 MED ORDER — ATORVASTATIN CALCIUM 10 MG PO TABS: 10.0000 mg | ORAL_TABLET | Freq: Every day | ORAL | 1 refills | Status: AC

## 2024-06-13 MED ORDER — METFORMIN HCL ER 500 MG PO TB24: 500.0000 mg | ORAL_TABLET | Freq: Two times a day (BID) | ORAL | 1 refills | Status: AC

## 2024-06-13 NOTE — Progress Notes (Signed)
 Needs interpreter please. Labs mostly good except triglycerides are too high, even without fasting. This is from eating too many carbohydrates or drinking alcohol. No sweets, cut whole fruit down to only 2-3 servings per day. Also her A1C is creeping back up - now at 6.3 and goal is <5.6. She needs to restart the Metformin !  Also needs to restart the Lipitor will help her triglycerides some, but mostly needs to cut the carbs. Refills of both sent to pharmacy. Any questions let me know, thx.

## 2024-06-16 ENCOUNTER — Other Ambulatory Visit (HOSPITAL_COMMUNITY): Payer: Self-pay

## 2024-06-16 ENCOUNTER — Telehealth: Payer: Self-pay

## 2024-06-16 DIAGNOSIS — R7303 Prediabetes: Secondary | ICD-10-CM

## 2024-06-16 NOTE — Telephone Encounter (Signed)
 Pharmacy Patient Advocate Encounter   Received notification from Onbase that prior authorization for Wegovy  0.25MG /0.5ML auto-injectors is required/requested.   Insurance verification completed.   The patient is insured through [employer] (COMMERCIAL) .   Per test claim:

## 2024-06-21 NOTE — Telephone Encounter (Unsigned)
 Copied from CRM #[id number]. Topic: Clinical - Lab/Test Results >> Jun 21, 2024  2:42 PM Franky GRADE wrote: Reason for CRM: Patient is returning a call regarding lab results, Advised patient of the results with Interpreter. patient is only taking 1-2 serving of fruits per day. She does eat almond cookies and wants to know if that counts as a Carb.  Patient will try to pick up the medication sometime this week as she does not drive, she would like an update on the weight loss medication she discussed with Corean Comment. >> Jun 21, 2024  2:49 PM Franky GRADE wrote: Patient would also like a print out of her lab results.

## 2024-06-22 ENCOUNTER — Telehealth: Payer: Self-pay

## 2024-06-22 ENCOUNTER — Other Ambulatory Visit: Payer: Self-pay | Admitting: Family

## 2024-06-22 DIAGNOSIS — R600 Localized edema: Secondary | ICD-10-CM

## 2024-06-22 MED ORDER — HYDROCHLOROTHIAZIDE 12.5 MG PO TABS: 12.5000 mg | ORAL_TABLET | Freq: Every morning | ORAL | 2 refills | Status: DC

## 2024-06-22 NOTE — Telephone Encounter (Signed)
 Copied from CRM [id number]. Topic: Clinical - Lab/Test Results >> Jun 21, 2024 11:24 AM Zebedee SAUNDERS wrote: Reason for CRM: Pt calling for lab results from 06/09/2024 per Corean Comment, NP notes provided information to pt. Pt would like medication as discussed with Corean Comment for swollen feet and weight loss. Pt would like to go into more detail about results. Please call pt at [phone].

## 2024-06-22 NOTE — Telephone Encounter (Signed)
 I returned call from Lynwood at [facility], Advised pharmacist pt should be taking both per PCP's order.   Copied from CRM [id number]. Topic: Clinical - Prescription Issue >> Jun 22, 2024 11:44 AM Chiquita SQUIBB wrote: Reason for CRM: Lynwood from [facility] pharmacy is calling in regarding the hydrochlorothiazide he stated last time the patient held the combination of losartan -hydrochlorothiazide. This time it was called in different. Please advise the pharmacy at [phone]

## 2024-06-22 NOTE — Telephone Encounter (Signed)
 let her know I did send in Hydrochlorothiazide to take every morning to help with the swelling in her feet. This will make her urinate frequently for about 2 hours in the mornings. she has to be sure she is drinking at least 2 liters of water all day long to avoid dehydration. Schedule f/u visit for 2 months to check labs and see me, thx.

## 2024-06-22 NOTE — Telephone Encounter (Signed)
 Yes almond cookies are an artificial, processed sweet and also a carbohydrate. Limit these to a few days per week. I sent in refills of her Metformin  & Lipitor on 9/21. Unfortunately, her insurance does not cover the Wegovy  and per the PA, they don't cover any meds for weight loss, only diabetes and with her A1C at 6.4 she is just under the cut off.  She should have received a denial letter from her insurance as well. Remind her I have offered a referral to our healthy weight & wellness clinic to meet with monthly to work on weight loss, ok to send referral if she is interested, let me know, thx.

## 2024-06-22 NOTE — Addendum Note (Signed)
 Addended by: Raylee Strehl on: 06/22/2024 10:02 AM   Modules accepted: Orders

## 2024-06-25 DIAGNOSIS — N179 Acute kidney failure, unspecified: Secondary | ICD-10-CM | POA: Diagnosis not present

## 2024-06-25 DIAGNOSIS — K5730 Diverticulosis of large intestine without perforation or abscess without bleeding: Secondary | ICD-10-CM | POA: Insufficient documentation

## 2024-06-25 DIAGNOSIS — M79604 Pain in right leg: Secondary | ICD-10-CM | POA: Diagnosis present

## 2024-06-25 DIAGNOSIS — R0602 Shortness of breath: Secondary | ICD-10-CM | POA: Insufficient documentation

## 2024-06-25 DIAGNOSIS — M79605 Pain in left leg: Secondary | ICD-10-CM | POA: Diagnosis not present

## 2024-06-25 DIAGNOSIS — D649 Anemia, unspecified: Secondary | ICD-10-CM | POA: Insufficient documentation

## 2024-06-25 DIAGNOSIS — E039 Hypothyroidism, unspecified: Secondary | ICD-10-CM | POA: Insufficient documentation

## 2024-06-25 DIAGNOSIS — Z79899 Other long term (current) drug therapy: Secondary | ICD-10-CM | POA: Insufficient documentation

## 2024-06-26 ENCOUNTER — Emergency Department (HOSPITAL_BASED_OUTPATIENT_CLINIC_OR_DEPARTMENT_OTHER)

## 2024-06-26 ENCOUNTER — Other Ambulatory Visit: Payer: Self-pay

## 2024-06-26 ENCOUNTER — Emergency Department (HOSPITAL_BASED_OUTPATIENT_CLINIC_OR_DEPARTMENT_OTHER): Admitting: Radiology

## 2024-06-26 ENCOUNTER — Emergency Department (HOSPITAL_BASED_OUTPATIENT_CLINIC_OR_DEPARTMENT_OTHER)
Admission: EM | Admit: 2024-06-26 | Discharge: 2024-06-26 | Disposition: A | Discharge status: Home / Self Care | Attending: Emergency Medicine | Admitting: Emergency Medicine

## 2024-06-26 DIAGNOSIS — D649 Anemia, unspecified: Secondary | ICD-10-CM

## 2024-06-26 DIAGNOSIS — N179 Acute kidney failure, unspecified: Secondary | ICD-10-CM

## 2024-06-26 DIAGNOSIS — K5790 Diverticulosis of intestine, part unspecified, without perforation or abscess without bleeding: Secondary | ICD-10-CM

## 2024-06-26 LAB — BASIC METABOLIC PANEL WITH GFR
Anion gap: 11 (ref 5–15)
BUN: 28 mg/dL — ABNORMAL HIGH (ref 8–23)
CO2: 28 mmol/L (ref 22–32)
Calcium: 10 mg/dL (ref 8.9–10.3)
Chloride: 97 mmol/L — ABNORMAL LOW (ref 98–111)
Creatinine, Ser: 1.37 mg/dL — ABNORMAL HIGH (ref 0.44–1.00)
GFR, Estimated: 41 mL/min — ABNORMAL LOW (ref >60)
Glucose, Bld: 117 mg/dL — ABNORMAL HIGH (ref 70–99)
Potassium: 3.7 mmol/L (ref 3.5–5.1)
Sodium: 135 mmol/L (ref 135–145)

## 2024-06-26 LAB — IRON AND TIBC
Iron: 23 ug/dL — ABNORMAL LOW (ref 28–170)
Saturation Ratios: 5 % — ABNORMAL LOW (ref 10.4–31.8)
TIBC: 491 ug/dL — ABNORMAL HIGH (ref 250–450)
UIBC: 468 ug/dL

## 2024-06-26 LAB — VITAMIN B12: Vitamin B-12: 871 pg/mL (ref 180–914)

## 2024-06-26 LAB — TROPONIN T, HIGH SENSITIVITY
Troponin T High Sensitivity: <15 ng/L (ref 0–19)
Troponin T High Sensitivity: <15 ng/L (ref 0–19)

## 2024-06-26 LAB — URINALYSIS, ROUTINE W REFLEX MICROSCOPIC
Bilirubin Urine: NEGATIVE
Glucose, UA: NEGATIVE mg/dL
Hgb urine dipstick: NEGATIVE
Ketones, ur: NEGATIVE mg/dL
Leukocytes,Ua: NEGATIVE
Nitrite: NEGATIVE
Protein, ur: NEGATIVE mg/dL
Specific Gravity, Urine: 1.01 (ref 1.005–1.030)
pH: 5.5 (ref 5.0–8.0)

## 2024-06-26 LAB — FERRITIN: Ferritin: 9 ng/mL — ABNORMAL LOW (ref 11–307)

## 2024-06-26 LAB — FOLATE: Folate: >20 ng/mL (ref >5.9)

## 2024-06-26 LAB — HEPATIC FUNCTION PANEL
ALT: 13 U/L (ref 0–44)
AST: 21 U/L (ref 15–41)
Albumin: 3.9 g/dL (ref 3.5–5.0)
Alkaline Phosphatase: 100 U/L (ref 38–126)
Bilirubin, Direct: <0.1 mg/dL (ref 0.0–0.2)
Total Bilirubin: 0.2 mg/dL (ref 0.0–1.2)
Total Protein: 7.6 g/dL (ref 6.5–8.1)

## 2024-06-26 LAB — RETICULOCYTES
Immature Retic Fract: 30 % — ABNORMAL HIGH (ref 2.3–15.9)
RBC.: 3.59 MIL/uL — ABNORMAL LOW (ref 3.87–5.11)
Retic Count, Absolute: 76.5 K/uL (ref 19.0–186.0)
Retic Ct Pct: 2.1 % (ref 0.4–3.1)

## 2024-06-26 LAB — CBC
HCT: 26.8 % — ABNORMAL LOW (ref 36.0–46.0)
Hemoglobin: 7.6 g/dL — ABNORMAL LOW (ref 12.0–15.0)
MCH: 21.5 pg — ABNORMAL LOW (ref 26.0–34.0)
MCHC: 28.4 g/dL — ABNORMAL LOW (ref 30.0–36.0)
MCV: 75.9 fL — ABNORMAL LOW (ref 80.0–100.0)
Platelets: 463 K/uL — ABNORMAL HIGH (ref 150–400)
RBC: 3.53 MIL/uL — ABNORMAL LOW (ref 3.87–5.11)
RDW: 18.3 % — ABNORMAL HIGH (ref 11.5–15.5)
WBC: 11.4 K/uL — ABNORMAL HIGH (ref 4.0–10.5)
nRBC: 0 % (ref 0.0–0.2)

## 2024-06-26 LAB — TSH: TSH: 3.38 u[IU]/mL (ref 0.350–4.500)

## 2024-06-26 LAB — T4, FREE: Free T4: 0.9 ng/dL (ref 0.61–1.12)

## 2024-06-26 LAB — CBG MONITORING, ED: Glucose-Capillary: 122 mg/dL — ABNORMAL HIGH (ref 70–99)

## 2024-06-26 LAB — CK: Total CK: 153 U/L (ref 38–234)

## 2024-06-26 LAB — PRO BRAIN NATRIURETIC PEPTIDE: Pro Brain Natriuretic Peptide: 56.6 pg/mL (ref <300.0)

## 2024-06-26 MED ORDER — IOHEXOL 300 MG/ML  SOLN
100.0000 mL | Freq: Once | INTRAMUSCULAR | Status: AC | PRN
  Administered 2024-06-26: 100 mL via INTRAVENOUS

## 2024-06-26 NOTE — Discharge Instructions (Signed)
 I suspect that most of your symptoms are related to the new anemia.  This is likely explained by the diverticulosis seen on your CT scan.  You should follow-up with a GI doctor for consideration of a colonoscopy.  If this does not show any issues then following up with a hematologist would be your next step.  I have put in the referral for the gastroenterologist and the phone number for the hematologist.  Avoid any anti-inflammatories or sedating type medications for now.  Please return to the ER for any significantly worsening symptoms, syncope, large passage of blood in your stool.   Sospecho que la [place] de sus sntomas estn relacionados con la nueva anemia. Esto probablemente se deba a la diverticulosis observada en su tomografa computarizada. Debera consultar con un gastroenterlogo para que considere una colonoscopia. Si esta no muestra ningn problema, el siguiente paso sera consultar con un hematlogo. He incluido la derivacin y el nmero de telfono del Architect. Evite cualquier antiinflamatorio o sedante por ahora. Por favor, vuelva a urgencias si presenta cualquier empeoramiento significativo de los sntomas, sncope o abundante sangre en las heces.  Ninguno de los [employer] de hoy explica el dolor de pierna. Por favor, contine con su mdico para realizar ms pruebas al respecto.  None of our findings today explain the leg pain.  Please continue following with your doctor for more testing related to that.

## 2024-06-26 NOTE — ED Triage Notes (Addendum)
 Feeling fatigued, pain in feet-intense aching pain in the muscles. Stiff and difficult to walk. Ongoing x1 year. Today reports swelling and xerostomia as new symptoms. Endorse palpitations today and SOB. Has not been taking her statin-just started taking again 4 days ago. Also just started taking metformin  again due to sugars running high.

## 2024-06-26 NOTE — ED Provider Notes (Signed)
 [facility] AT [facility] Provider Note   CSN: [id number] Arrival date & time: 06/25/24  2357     Patient presents with: Multiple complaints   Nina Patterson is a 73 y.o. female.   presenting with a chief complaint of worsening bilateral leg pain and numbness, persisting for the past year and a half. The pain is described as intermittent but has become more frequent and severe, affecting both legs equally today, though historically more pronounced on the left side. The numbness extends from the left thigh to the knee. The patient reports difficulty walking due to fatigue and shortness of breath, exacerbated by weight gain attributed to hypothyroidism. She denies any back pain but occasionally experiences shoulder pain. The pain does not intensify with walking but is problematic during positional changes in bed. She denies any constipation, diarrhea, or blood in her stool. The patient has a history of hypothyroidism and has been prescribed a muscle relaxer, which she reports can cause dizziness. She does not currently have a cardiologist and has not been able to start weight management medication due to insurance issues. History was obtained from the patient and her daughter.        Prior to Admission medications   Medication Sig Start Date End Date Taking? Authorizing Provider  acetaminophen (TYLENOL) 650 MG CR tablet Take 650 mg by mouth every 8 (eight) hours as needed for pain.    [provider]  atorvastatin  (LIPITOR) 10 MG tablet Take 1 tablet (10 mg total) by mouth at bedtime. 06/13/24   Lucius Krabbe, NP  Berberine Chloride (BERBERINE HCI PO) Take by mouth.    [provider]  busPIRone  (BUSPAR ) 5 MG tablet Take 1 tablet (5 mg total) by mouth 2 (two) times daily. For Anxiety. Patient not taking: Reported on 06/09/2024 12/05/23   Lucius Krabbe, NP  capsaicin (ZOSTRIX) 0.025 % cream Apply topically. 02/04/22    [provider]  cholecalciferol (VITAMIN D3) 25 MCG (1000 UNIT) tablet Take 1,000 Units by mouth daily.    [provider]  cyclobenzaprine  (FLEXERIL ) 5 MG tablet Take 1-2 tablets (5-10 mg total) by mouth 3 (three) times daily as needed (For leg pain. May cause drowsiness and can take 2 pills at bedtime.). 06/25/23   Lucius Krabbe, NP  EQ ASPIRIN ADULT LOW DOSE 81 MG EC tablet Take 81 mg by mouth daily. 04/09/21   [provider]  FOLIC ACID PO Take by mouth.    [provider]  hydrochlorothiazide (HYDRODIURIL) 12.5 MG tablet Take 1 tablet (12.5 mg total) by mouth every morning. Para la hinchazon de piernas. 06/22/24   Lucius Krabbe, NP  levothyroxine  (SYNTHROID ) 75 MCG tablet Take 1 tablet (75 mcg total) by mouth daily. 12/09/23   Lucius Krabbe, NP  losartan -hydrochlorothiazide (HYZAAR) 50-12.5 MG tablet Take 1 tablet by mouth daily. 06/09/24   Lucius Krabbe, NP  magnesium gluconate (MAGONATE) 500 (27 Mg) MG TABS tablet Take 500 mg by mouth in the morning and at bedtime.    [provider]  metFORMIN  (GLUCOPHAGE -XR) 500 MG 24 hr tablet Take 1 tablet (500 mg total) by mouth 2 (two) times daily with a meal. 06/13/24   Hudnell, Stephanie, NP  Oil of Oregano 1500 MG CAPS Take 1 capsule by mouth daily at 6 (six) AM.    [provider]  Omega-3 Fatty Acids (FISH OIL) 1000 MG CPDR Take 1 tablet by mouth in the morning, at noon, and at bedtime.    [provider]  vitamin E 1000 UNIT capsule Take 1,000 Units by mouth daily.    [provider]    Allergies: Semaglutide     Review of Systems  Updated Vital Signs BP (!) 119/50   Pulse 81   Temp 98 F (36.7 C)   Resp 20   SpO2 98%   Physical Exam Vitals and nursing note reviewed.  Constitutional:      Appearance: She is well-developed.  HENT:     Head: Normocephalic and atraumatic.  Cardiovascular:     Rate and Rhythm: Normal rate and regular rhythm.   Pulmonary:     Effort: No respiratory distress.     Breath sounds: No stridor.  Abdominal:     General: There is no distension.  Musculoskeletal:     Cervical back: Normal range of motion.     Right lower leg: Edema present.     Left lower leg: Edema present.  Skin:    General: Skin is warm and dry.  Neurological:     General: No focal deficit present.     Mental Status: She is alert.     (all labs ordered are listed, but only abnormal results are displayed) Labs Reviewed  BASIC METABOLIC PANEL WITH GFR - Abnormal; Notable for the following components:      Result Value   Chloride 97 (*)    Glucose, Bld 117 (*)    BUN 28 (*)    Creatinine, Ser 1.37 (*)    GFR, Estimated 41 (*)    All other components within normal limits  CBC - Abnormal; Notable for the following components:   WBC 11.4 (*)    RBC 3.53 (*)    Hemoglobin 7.6 (*)    HCT 26.8 (*)    MCV 75.9 (*)    MCH 21.5 (*)    MCHC 28.4 (*)    RDW 18.3 (*)    Platelets 463 (*)    All other components within normal limits  URINALYSIS, ROUTINE W REFLEX MICROSCOPIC - Abnormal; Notable for the following components:   Color, Urine COLORLESS (*)    All other components within normal limits  FERRITIN - Abnormal; Notable for the following components:   Ferritin 9 (*)    All other components within normal limits  RETICULOCYTES - Abnormal; Notable for the following components:   RBC. 3.59 (*)    Immature Retic Fract 30.0 (*)    All other components within normal limits  CBG MONITORING, ED - Abnormal; Notable for the following components:   Glucose-Capillary 122 (*)    All other components within normal limits  HEPATIC FUNCTION PANEL  CK  PRO BRAIN NATRIURETIC PEPTIDE  TSH  T4, FREE  VITAMIN B12  FOLATE  IRON AND TIBC  OCCULT BLOOD X 1 CARD TO LAB, STOOL  TROPONIN T, HIGH SENSITIVITY  TROPONIN T, HIGH SENSITIVITY    EKG: None  Radiology: CT L-SPINE NO CHARGE Result Date: 06/26/2024 EXAM: CT OF THE LUMBAR  SPINE WITHOUT CONTRAST 06/26/2024 02:50:38 AM TECHNIQUE: CT of the lumbar spine was performed without the administration of intravenous contrast. Multiplanar reformatted images are provided for review. Automated exposure control, iterative reconstruction, and/or weight based adjustment of the mA/kV was utilized to reduce the radiation dose to as low as reasonably achievable. COMPARISON: None available. CLINICAL HISTORY: Feeling fatigued, pain in feet-intense aching pain in the muscles. Stiff and difficult to walk. Ongoing x1 year. Today reports swelling and xerostomia as new symptoms. Endorse palpitations today and SOB.  FINDINGS: BONES AND ALIGNMENT: 2 mm retrolisthesis L1-2 and L3-4 and 5-mm retrolisthesis L2-3, likely degenerative in nature. Accentuated lumbar lordosis. No acute fracture of the lumbar spine. Remote superior endplate fracture of L2 with minimal loss of height anteriorly and no retropulsion. Remaining Vertebral body height is preserved. DEGENERATIVE CHANGES: There is intervertebral disc space narrowing, endplate remodeling, and vacuum disc phenomenon of T12-L3 in keeping with changes of severe degenerative disc disease. Moderate degenerative changes are seen at L3-4. SOFT TISSUES: No paraspinal fluid collections or inflammatory changes are identified. L1-L2: Right paracentral, foraminal, and extraforaminal calcified disc osteophyte complex results in severe right neural foraminal narrowing. No significant left neural foraminal narrowing. Mild eccentric central canal stenosis. Mild-to-moderate bilateral facet arthrosis. L2-L3: Moderate bilateral facet arthrosis. In combination with disc height loss and retrolisthesis, there is resultant severe right neural foraminal narrowing. No significant neural foraminal narrowing on the left. No significant canal stenosis. L3-L4: Moderate left and mild right neural foraminal narrowing. Severe bilateral facet arthrosis. No significant canal stenosis. L4-L5:  Broad-based disc bulge. Moderate left and mild right neural foraminal narrowing. Severe bilateral facet arthrosis. No significant canal stenosis. L5-S1: Severe bilateral facet arthrosis. No significant neural foraminal narrowing. No canal stenosis. IMPRESSION: 1. Severe degenerative disc disease at T12-L3 with associated 2 mm retrolisthesis at L1-2 and L3-4 and 5 mm retrolisthesis at L2-3, likely degenerative in nature. 2. Severe right neural foraminal narrowing at L1-2 and L2-3. 3. Moderate left and mild right neural foraminal narrowing at L3-4 and L4-5. 4. Severe bilateral facet arthrosis L3 -S1 Electronically signed by: Dorethia Molt MD 06/26/2024 03:39 AM EDT RP Workstation: [id number]   CT ABDOMEN PELVIS W CONTRAST Result Date: 06/26/2024 EXAM: CT ABDOMEN AND PELVIS WITH CONTRAST 06/26/2024 02:50:38 AM TECHNIQUE: CT of the abdomen and pelvis was performed with the administration of intravenous contrast. Multiplanar reformatted images are provided for review. Automated exposure control, iterative reconstruction, and/or weight-based adjustment of the mA/kV was utilized to reduce the radiation dose to as low as reasonably achievable. COMPARISON: None available. CLINICAL HISTORY: Abdominal pain, acute, nonlocalized. Feeling fatigued, pain in feet-intense aching pain in the muscles. Stiff and difficult to walk. Ongoing x1 year. Today reports swelling and xerostomia as new symptoms. Endorse palpitations today and SOB. FINDINGS: LOWER CHEST: No acute abnormality. LIVER: The liver is unremarkable. GALLBLADDER AND BILE DUCTS: Gallbladder is unremarkable. No biliary ductal dilatation. SPLEEN: No acute abnormality. PANCREAS: No acute abnormality. ADRENAL GLANDS: No acute abnormality. KIDNEYS, URETERS AND BLADDER: No stones in the kidneys or ureters. No hydronephrosis. No perinephric or periureteral stranding. Urinary bladder is unremarkable. GI AND BOWEL: Severe descending and sigmoid colonic diverticulosis without  superimposed acute inflammatory change. The stomach, small bowel, and large bowel are otherwise unremarkable. The appendix is normal. PERITONEUM AND RETROPERITONEUM: No ascites. No free air. VASCULATURE: Mild aortoiliac atherosclerotic calcification. No aortic aneurysm. Aorta is normal in caliber. LYMPH NODES: No lymphadenopathy. REPRODUCTIVE ORGANS: No acute abnormality. BONES AND SOFT TISSUES: Remote superior endplate fracture L2 with minimal loss of height anteriorly and no retropulsion. Mild retrolisthesis L2-3 and L3-4 likely degenerative in nature. Osseous structures are age appropriate. No acute bone abnormality. No lytic or blastic bone lesion. No focal soft tissue abnormality. IMPRESSION: 1. No acute findings in the abdomen or pelvis. 2. Severe descending and sigmoid colonic diverticulosis without superimposed acute inflammatory change. Electronically signed by: Dorethia Molt MD 06/26/2024 03:25 AM EDT RP Workstation: [id number]   DG Chest Port 1 View Result Date: 06/26/2024 CLINICAL DATA:  [id number]. Chest pain and shortness  of breath with palpitations. EXAM: PORTABLE CHEST 1 VIEW COMPARISON:  None. FINDINGS: The heart size and mediastinal contours are within normal limits. There is a calcified granuloma in the right lower lung field. Both lungs are clear of infiltrates with the bases not optimally seen due to redundant abdominal soft tissues superimposing over the lower chest. The visualized skeletal structures are unremarkable. There is a tangle of overlying monitor wiring. IMPRESSION: No evidence of acute chest disease. Calcified granuloma in the right lower lung field. Electronically Signed   By: Francis Quam M.D.   On: 06/26/2024 02:53     Procedures   Medications Ordered in the ED  iohexol (OMNIPAQUE) 300 MG/ML solution 100 mL (100 mLs Intravenous Contrast Given 06/26/24 0250)                                    Medical Decision Making Amount and/or Complexity of Data Reviewed Labs:  ordered. Radiology: ordered.  Risk Prescription drug management.   Patient mostly talked about her leg pain with triage nurse however when I evaluated her she was more concerned about her progressively worsening dyspnea on exertion, weakness, fatigue and weight gain along with abdominal distention.  She states that she has never been this bloated before.  She states leg pain is only when she moves and bed but she has really have leg pain with walking she attributes her difficulty walking related to the fatigue.  She does have some muscle soreness especially in her thighs, this may be related to statins as her CK is normal.  She will need to follow-up with her PCP for that.  She was found to have a new anemia and pretty significant diverticulosis on her CT scan.  I suspect is probably diverticular bleed needs to see a gastroenterologist.  Mild AKI compared to her baseline which also could be related to the anemia.  Does not need a blood transfusion.  Anemia panel added for outpatient workup.  Will refer to hematology.  Final diagnoses:  AKI (acute kidney injury)  Anemia, unspecified type  Diverticulosis    ED Discharge Orders          Ordered    Ambulatory referral to Gastroenterology        06/26/24 0440               Kail Fraley, Selinda, MD 06/26/24 [phone]

## 2024-06-28 NOTE — Addendum Note (Signed)
 Addended by: Akeisha Lagerquist on: 06/28/2024 08:08 AM   Modules accepted: Orders

## 2024-06-28 NOTE — Telephone Encounter (Signed)
 The name given is not a medicine. Is she asking about Liraglutide, Semaglutide , or tirzepitide? These are in the GLP-1 class, all injectables.  She can pay out of pocket and I send the RX directly to the pharmaceutical company - it costs $350 for first dose and then $500 per month. She can also get from [facility] now and she can call and ask their pricing. But no other local pharmacies are offering these prices, they are much higher, close to $1,000 per month. I have sent the referral to [facility].

## 2024-06-29 NOTE — Telephone Encounter (Signed)
 Called patient to ask about note from previous call. Used interpretor but still unable to resolve due to language barrier. Pt wasn't understanding but stated she has an appt with Jodie on 06/30/24 and will just get info needed during appt.

## 2024-06-30 ENCOUNTER — Encounter: Payer: Self-pay | Admitting: Family Medicine

## 2024-06-30 ENCOUNTER — Ambulatory Visit (INDEPENDENT_AMBULATORY_CARE_PROVIDER_SITE_OTHER): Admitting: Family Medicine

## 2024-06-30 VITALS — BP 128/70 | HR 66 | Temp 97.7°F | Ht 60.0 in | Wt 210.0 lb

## 2024-06-30 DIAGNOSIS — D500 Iron deficiency anemia secondary to blood loss (chronic): Secondary | ICD-10-CM | POA: Diagnosis not present

## 2024-06-30 DIAGNOSIS — N179 Acute kidney failure, unspecified: Secondary | ICD-10-CM

## 2024-06-30 DIAGNOSIS — Z8719 Personal history of other diseases of the digestive system: Secondary | ICD-10-CM

## 2024-06-30 DIAGNOSIS — I152 Hypertension secondary to endocrine disorders: Secondary | ICD-10-CM

## 2024-06-30 DIAGNOSIS — E039 Hypothyroidism, unspecified: Secondary | ICD-10-CM | POA: Diagnosis not present

## 2024-06-30 DIAGNOSIS — E1159 Type 2 diabetes mellitus with other circulatory complications: Secondary | ICD-10-CM

## 2024-06-30 NOTE — Progress Notes (Signed)
 Subjective  CC:  Chief Complaint  Patient presents with   Hospitalization Follow-up    06/26/2024 (5 hours) [facility] [place] Emergency Department at [facility]   AKI (acute kidney injury) +2 more       HPI: Nina Patterson is a 73 y.o. female who presents to the office today to address the problems listed above in the chief complaint. Discussed the use of AI scribe software for clinical note transcription with the patient, who gave verbal consent to proceed.  HOSPITAL FOLLOW UP, NEW PATIENT TO ME; CHART REVIEWED History of Present Illness Nina Patterson is a 73 year old female with anemia who presents for urgent referral to a gastroenterologist for endoscopy. She is accompanied by her daughter. An interpreter was used for this visit.  Anemia and associated symptoms 73 year old who presented to ER due to leg pain.  Further evaluation showed significant anemia, shortness of breath, fatigue.  Abdominal CT showed diverticular disease but was otherwise unremarkable.  It was presumed that she had GI blood loss from diverticular bleeding, painless.  Patient denies symptoms of melena or bright red blood per rectum.  She also was found to have acute renal insufficiency likely prerenal.  She was discharged with a referral to GI.  She is here for follow-up. - Severe anemia with hemoglobin level of 7.6 g/dL during recent hospitalization, last CBC from June 17 showed hemoglobin of 10.8. - Significant weakness and fatigue progressively worsening over the past 1.5 years - Significant fatigue and shortness of breath impacting ability to work at [employer] - Not currently taking iron supplements, as she was not provided during recent emergency room visit - She has not yet received an appointment for GI.   No visits with results within 1 Day(s) from this visit.  Latest known visit with results is:  Admission on 06/26/2024, Discharged on 06/26/2024  Component Date Value Ref  Range Status   Sodium 06/26/2024 135  135 - 145 mmol/L Final   Potassium 06/26/2024 3.7  3.5 - 5.1 mmol/L Final   Chloride 06/26/2024 97 (L)  98 - 111 mmol/L Final   CO2 06/26/2024 28  22 - 32 mmol/L Final   Glucose, Bld 06/26/2024 117 (H)  70 - 99 mg/dL Final   BUN 89/95/7974 28 (H)  8 - 23 mg/dL Final   Creatinine, Ser 06/26/2024 1.37 (H)  0.44 - 1.00 mg/dL Final   Calcium  06/26/2024 10.0  8.9 - 10.3 mg/dL Final   GFR, Estimated 06/26/2024 41 (L)  >60 mL/min Final   Anion gap 06/26/2024 11  5 - 15 Final   WBC 06/26/2024 11.4 (H)  4.0 - 10.5 K/uL Final   RBC 06/26/2024 3.53 (L)  3.87 - 5.11 MIL/uL Final   Hemoglobin 06/26/2024 7.6 (L)  12.0 - 15.0 g/dL Final   HCT 89/95/7974 26.8 (L)  36.0 - 46.0 % Final   MCV 06/26/2024 75.9 (L)  80.0 - 100.0 fL Final   MCH 06/26/2024 21.5 (L)  26.0 - 34.0 pg Final   MCHC 06/26/2024 28.4 (L)  30.0 - 36.0 g/dL Final   RDW 89/95/7974 18.3 (H)  11.5 - 15.5 % Final   Platelets 06/26/2024 463 (H)  150 - 400 K/uL Final   nRBC 06/26/2024 0.0  0.0 - 0.2 % Final   Troponin T High Sensitivity 06/26/2024 <15  0 - 19 ng/L Final   Glucose-Capillary 06/26/2024 122 (H)  70 - 99 mg/dL Final   Color, Urine 89/95/7974 COLORLESS (A)  YELLOW  Final   APPearance 06/26/2024 CLEAR  CLEAR Final   Specific Gravity, Urine 06/26/2024 1.010  1.005 - 1.030 Final   pH 06/26/2024 5.5  5.0 - 8.0 Final   Glucose, UA 06/26/2024 NEGATIVE  NEGATIVE mg/dL Final   Hgb urine dipstick 06/26/2024 NEGATIVE  NEGATIVE Final   Bilirubin Urine 06/26/2024 NEGATIVE  NEGATIVE Final   Ketones, ur 06/26/2024 NEGATIVE  NEGATIVE mg/dL Final   Protein, ur 89/95/7974 NEGATIVE  NEGATIVE mg/dL Final   Nitrite 89/95/7974 NEGATIVE  NEGATIVE Final   Leukocytes,Ua 06/26/2024 NEGATIVE  NEGATIVE Final   Total Protein 06/26/2024 7.6  6.5 - 8.1 g/dL Final   Albumin 89/95/7974 3.9  3.5 - 5.0 g/dL Final   AST 89/95/7974 21  15 - 41 U/L Final   ALT 06/26/2024 13  0 - 44 U/L Final   Alkaline Phosphatase  06/26/2024 100  38 - 126 U/L Final   Total Bilirubin 06/26/2024 0.2  0.0 - 1.2 mg/dL Final   Bilirubin, Direct 06/26/2024 <0.1  0.0 - 0.2 mg/dL Final   Indirect Bilirubin 06/26/2024 NOT CALCULATED  0.3 - 0.9 mg/dL Final   Total CK 89/95/7974 153  38 - 234 U/L Final   Pro Brain Natriuretic Peptide 06/26/2024 56.6  <300.0 pg/mL Final   Troponin T High Sensitivity 06/26/2024 <15  0 - 19 ng/L Final   TSH 06/26/2024 3.380  0.350 - 4.500 uIU/mL Final   Free T4 06/26/2024 0.90  0.61 - 1.12 ng/dL Final   Vitamin A-87 89/95/7974 871  180 - 914 pg/mL Final   Folate 06/26/2024 >20.0  >5.9 ng/mL Final   Iron 06/26/2024 23 (L)  28 - 170 ug/dL Final   TIBC 89/95/7974 491 (H)  250 - 450 ug/dL Final   Saturation Ratios 06/26/2024 5 (L)  10.4 - 31.8 % Final   UIBC 06/26/2024 468  ug/dL Final   Ferritin 89/95/7974 9 (L)  11 - 307 ng/mL Final   Retic Ct Pct 06/26/2024 2.1  0.4 - 3.1 % Final   RBC. 06/26/2024 3.59 (L)  3.87 - 5.11 MIL/uL Final   Retic Count, Absolute 06/26/2024 76.5  19.0 - 186.0 K/uL Final   Immature Retic Fract 06/26/2024 30.0 (H)  2.3 - 15.9 % Final   Review of systems negative for chest pain or palpitations.  No syncope.  Assessment  1. AKI (acute kidney injury)   2. History of GI diverticular bleed   3. Iron deficiency anemia due to chronic blood loss   4. Acquired hypothyroidism   5. Hypertension associated with type 2 diabetes mellitus (HCC)   6. Morbid obesity (HCC)      Plan  Assessment and Plan Assessment & Plan Iron deficiency anemia due to chronic blood loss Iron deficiency anemia likely due to chronic blood loss from the gastrointestinal tract. Hemoglobin level is significantly low at 7.6 g/dL, contributing to symptoms of fatigue and shortness of breath. No current abdominal pain or evidence of gastrointestinal bleeding such as melena or hematochezia. - Start over-the-counter iron supplementation twice daily - Place urgent referral to gastroenterologist for further  evaluation.  Needs endoscopy. - Schedule follow-up appointment in two weeks for blood work and reassessment, I recommend rechecking CBC every 2 to 4 weeks.  If hemoglobin drops below 7, I recommend blood transfusion. - Provide work note for absence during October and November for treatment, current anemia and age make recommendation for staying out of work until further evaluation reasonable.  Hypertension is controlled.  No symptoms of hypotension at this time.  Will follow  Reassured that TSH was normal on levothyroxine .  Other chronic problems per PCP.    Follow up: 2 to 4 weeks to recheck CBC No orders of the defined types were placed in this encounter.  No orders of the defined types were placed in this encounter.    I reviewed the patients updated PMH, FH, and SocHx.  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   atorvastatin  (LIPITOR) 10 MG tablet Take 1 tablet (10 mg total) by mouth at bedtime.   Berberine Chloride (BERBERINE HCI PO) Take by mouth.   capsaicin (ZOSTRIX) 0.025 % cream Apply topically.   cholecalciferol (VITAMIN D3) 25 MCG (1000 UNIT) tablet Take 1,000 Units by mouth daily.   cyclobenzaprine  (FLEXERIL ) 5 MG tablet Take 1-2 tablets (5-10 mg total) by mouth 3 (three) times daily as  needed (For leg pain. May cause drowsiness and can take 2 pills at bedtime.).   EQ ASPIRIN ADULT LOW DOSE 81 MG EC tablet Take 81 mg by mouth daily.   FOLIC ACID PO Take by mouth.   levothyroxine  (SYNTHROID ) 75 MCG tablet Take 1 tablet (75 mcg total) by mouth daily.   losartan -hydrochlorothiazide (HYZAAR) 50-12.5 MG tablet Take 1 tablet by mouth daily.   magnesium gluconate (MAGONATE) 500 (27 Mg) MG TABS tablet Take 500 mg by mouth in the morning and at bedtime.   metFORMIN  (GLUCOPHAGE -XR) 500 MG 24 hr tablet Take 1 tablet (500 mg total) by mouth 2 (two) times daily with a meal.   Oil of Oregano 1500 MG CAPS Take 1 capsule by mouth daily at 6 (six) AM.   Omega-3 Fatty Acids (FISH OIL) 1000 MG CPDR Take 1 tablet by mouth in the morning, at noon, and at bedtime.   Allergies: Patient is allergic to semaglutide . Family History: Patient family history is not on file. Social History:  Patient  reports that she has never smoked. She has never used smokeless tobacco. She reports that she does not currently use alcohol.  Review of Systems: Constitutional: Negative for fever malaise or anorexia Cardiovascular: negative for chest pain Respiratory: negative for SOB or persistent cough Gastrointestinal: negative for abdominal pain  Objective  Vitals: BP 128/70   Pulse 66   Temp 97.7 F (36.5 C)   Ht 5' (1.524 m)   Wt 210 lb (95.3 kg)   SpO2 95%   BMI 41.01 kg/m  General: no acute distress , A&Ox3, appears well HEENT: PEERL, conjunctiva normal, neck is supple Cardiovascular:  RRR without murmur or gallop.  Respiratory:  Good breath sounds bilaterally, CTAB with normal respiratory effort Abdomen is soft and nontender No edema Skin:  Warm, no rashes Commons side effects, risks, benefits, and alternatives for medications and treatment plan prescribed today were discussed, and the patient expressed understanding of the given instructions. Patient is instructed to call  or message via MyChart if  he/she has any questions or concerns regarding our treatment plan. No barriers to understanding were identified. We discussed Red Flag symptoms and signs in detail. Patient expressed understanding regarding what to do in case of urgent or emergency type symptoms.  Medication list was reconciled, printed and provided to the patient in AVS. Patient instructions and summary information was reviewed with the patient as documented in the AVS. This note was prepared with assistance of Dragon voice recognition software. Occasional wrong-word or sound-a-like substitutions may have occurred due to the inherent limitations of voice recognition software

## 2024-07-14 ENCOUNTER — Ambulatory Visit: Admitting: Family

## 2024-07-14 ENCOUNTER — Encounter: Payer: Self-pay | Admitting: Family

## 2024-07-14 VITALS — BP 148/82 | HR 71 | Temp 97.2°F | Ht 60.0 in | Wt 208.4 lb

## 2024-07-14 DIAGNOSIS — Z6841 Body Mass Index (BMI) 40.0 and over, adult: Secondary | ICD-10-CM

## 2024-07-14 DIAGNOSIS — I152 Hypertension secondary to endocrine disorders: Secondary | ICD-10-CM

## 2024-07-14 DIAGNOSIS — D500 Iron deficiency anemia secondary to blood loss (chronic): Secondary | ICD-10-CM | POA: Diagnosis not present

## 2024-07-14 LAB — CBC WITH DIFFERENTIAL/PLATELET
Basophils Absolute: 0 K/uL (ref 0.0–0.1)
Basophils Relative: 0.6 % (ref 0.0–3.0)
Eosinophils Absolute: 0.2 K/uL (ref 0.0–0.7)
Eosinophils Relative: 3 % (ref 0.0–5.0)
HCT: 32.8 % — ABNORMAL LOW (ref 36.0–46.0)
Hemoglobin: 10 g/dL — ABNORMAL LOW (ref 12.0–15.0)
Lymphocytes Relative: 31.8 % (ref 12.0–46.0)
Lymphs Abs: 2.6 K/uL (ref 0.7–4.0)
MCHC: 30.6 g/dL (ref 30.0–36.0)
MCV: 79.4 fl (ref 78.0–100.0)
Monocytes Absolute: 0.5 K/uL (ref 0.1–1.0)
Monocytes Relative: 5.6 % (ref 3.0–12.0)
Neutro Abs: 4.8 K/uL (ref 1.4–7.7)
Neutrophils Relative %: 59 % (ref 43.0–77.0)
Platelets: 381 K/uL (ref 150.0–400.0)
RBC: 4.13 Mil/uL (ref 3.87–5.11)
RDW: 29.6 % — ABNORMAL HIGH (ref 11.5–15.5)
WBC: 8.2 K/uL (ref 4.0–10.5)

## 2024-07-14 LAB — FERRITIN: Ferritin: 17 ng/mL (ref 10.0–291.0)

## 2024-07-14 MED ORDER — LOSARTAN POTASSIUM 50 MG PO TABS: 50.0000 mg | ORAL_TABLET | Freq: Every day | ORAL | 1 refills | Status: DC

## 2024-07-14 NOTE — Patient Instructions (Addendum)
  Qu gusto verte hoy!  Llama a Gastroenterologa si tienes una posible hemorragia estomacal. [phone]  Sigue sin tomar hidroclorotiazida. Sigue tomando Losartn-HCTZ para la presin arterial todas las maanas. Y he aadido otra dosis de Losartn para tomar por la noche. Empieza esta noche.  Programa una cita de seguimiento conmigo en 4 semanas.   NOTA:  Si te has hecho alguna prueba de laboratorio, avsanos si no has recibido [employer]. Es posible que veas tus resultados en MyChart antes de que podamos revisarlos, Biomedical engineer te llamaremos una vez que los [place]. Si solicitamos alguna derivacin hoy, avsanos si no has tenido noticias de su consultorio en la prxima semana.

## 2024-07-14 NOTE — Progress Notes (Signed)
 Patient ID: Taneya Conkel, female    DOB: November 21, 1950, 73 y.o.   MRN: [id number]  Chief Complaint  Patient presents with   Follow-up    Pt was seen in ED on 10/04.   Discussed the use of AI scribe software for clinical note transcription with the patient, who gave verbal consent to proceed. Due to language barrier, an interpreter was present during the history-taking and subsequent discussion (and for part of the physical exam) with this patient.  History of Present Illness Jonika Nikala Walsworth is a 73 year old female with anemia who presents with weakness and fatigue.  She has experienced significant weakness and fatigue for six months, impairing her ability to stand or walk. A blood test on September 18 showed a hemoglobin level of 7.6. She has been taking OTC iron supplements, 65 mg, two pills daily, and notes dark stools. Her daughter purchased additional iron supplements, including a liquid form, but she has not taken this yet.  She has high blood pressure and was advised to discontinue a diuretic for foot swelling. She continues to take losartan , which contains a diuretic, and reports no headaches. She was advised to stop aspirin due to bleeding risk.  She was diagnosed with diverticulosis during a recent hospital visit and has a gastroenterology appointment on November 6. She was given three weeks off work, ending October 29, and requests an extension as she does not feel ready to return.  In June of last year her hemoglobin level was 10. She experiences fatigue, weakness, and occasional difficulty breathing.  Assessment & Plan Iron deficiency anemia due to chronic blood loss Iron deficiency anemia with hemoglobin recently at 7.6. Symptoms include significant weakness and difficulty standing. Recent research suggests limited absorption of extra doses of iron supplements, and increased side effects.  - Reduce iron intake to 65 mg once daily. - Recheck blood count  today. Advised it can take up to 2 months to see improvement.  - Recommend taking iron on an empty stomach for better absorption, but can take with food if nausea occurs and take with Vitamin C.  - Recommend slow-release iron for future prescriptions. - Keep GI appointment on 11/6.   Hypertension Hypertension with current elevated blood pressure readings. Continues to take losartan -HCTZ.  Advised to stop her extra HCTZ in hospital due to feeling weak in her legs. Reports no increased ankle edema. - Increase dose of Losartan  to 50mg  qpm. - Continue Losartan -HCTZ 50-12.5mg  qam. - Recheck blood pressure in four weeks.  Diverticulosis Diagnosed with diverticulosis, with a referral to gastroenterology for further evaluation. Concern for potential progression to diverticulitis. - Attend gastroenterology appointment on November 6th for further evaluation.  Follow-Up Follow-up plans discussed for ongoing management of conditions. - Provide work note extending leave until November 15th. - Schedule follow-up appointment after gastroenterology visit. - Recheck blood pressure in four weeks.   Subjective:    Outpatient Medications Prior to Visit  Medication Sig Dispense Refill   acetaminophen (TYLENOL) 650 MG CR tablet Take 650 mg by mouth every 8 (eight) hours as needed for pain.     atorvastatin  (LIPITOR) 10 MG tablet Take 1 tablet (10 mg total) by mouth at bedtime. 90 tablet 1   Berberine Chloride (BERBERINE HCI PO) Take by mouth.     capsaicin (ZOSTRIX) 0.025 % cream Apply topically.     cholecalciferol (VITAMIN D3) 25 MCG (1000 UNIT) tablet Take 1,000 Units by mouth daily.     cyclobenzaprine  (FLEXERIL ) 5  MG tablet Take 1-2 tablets (5-10 mg total) by mouth 3 (three) times daily as needed (For leg pain. May cause drowsiness and can take 2 pills at bedtime.). 60 tablet 2   EQ ASPIRIN ADULT LOW DOSE 81 MG EC tablet Take 81 mg by mouth daily.     ferrous sulfate 324 MG TBEC Take 324 mg by mouth.      FOLIC ACID PO Take by mouth.     levothyroxine  (SYNTHROID ) 75 MCG tablet Take 1 tablet (75 mcg total) by mouth daily. 90 tablet 1   losartan -hydrochlorothiazide (HYZAAR) 50-12.5 MG tablet Take 1 tablet by mouth daily. 90 tablet 0   magnesium gluconate (MAGONATE) 500 (27 Mg) MG TABS tablet Take 500 mg by mouth in the morning and at bedtime.     metFORMIN  (GLUCOPHAGE -XR) 500 MG 24 hr tablet Take 1 tablet (500 mg total) by mouth 2 (two) times daily with a meal. 180 tablet 1   Oil of Oregano 1500 MG CAPS Take 1 capsule by mouth daily at 6 (six) AM.     Omega-3 Fatty Acids (FISH OIL) 1000 MG CPDR Take 1 tablet by mouth in the morning, at noon, and at bedtime.     busPIRone  (BUSPAR ) 5 MG tablet Take 1 tablet (5 mg total) by mouth 2 (two) times daily. For Anxiety. (Patient not taking: Reported on 06/30/2024) 60 tablet 5   vitamin E 1000 UNIT capsule Take 1,000 Units by mouth daily. (Patient not taking: Reported on 06/30/2024)     hydrochlorothiazide (HYDRODIURIL) 12.5 MG tablet Take 1 tablet (12.5 mg total) by mouth every morning. Para la hinchazon de piernas. (Patient not taking: Reported on 06/30/2024) 30 tablet 2   No facility-administered medications prior to visit.   Past Medical History:  Diagnosis Date   Class 2 severe obesity due to excess calories with serious comorbidity and body mass index (BMI) of 39.0 to 39.9 in adult 08/07/2021   Diabetes mellitus without complication (HCC)    Hypertension    Hypothyroidism    Osteoarthritis    Primary hypertension 08/07/2021   No past surgical history on file. Allergies  Allergen Reactions   Semaglutide  Itching and Rash      Objective:    Physical Exam Vitals and nursing note reviewed.  Constitutional:      Appearance: Normal appearance. She is obese.  Cardiovascular:     Rate and Rhythm: Normal rate and regular rhythm.  Pulmonary:     Effort: Pulmonary effort is normal.     Breath sounds: Normal breath sounds.  Musculoskeletal:         General: Normal range of motion.  Skin:    General: Skin is warm and dry.  Neurological:     Mental Status: She is alert.  Psychiatric:        Mood and Affect: Mood normal.        Behavior: Behavior normal.    BP (!) 148/82 (BP Location: Left Arm, Patient Position: Sitting, Cuff Size: Large)   Pulse 71   Temp (!) 97.2 F (36.2 C) (Temporal)   Ht 5' (1.524 m)   Wt 208 lb 6.4 oz (94.5 kg)   SpO2 98%   BMI 40.70 kg/m  Wt Readings from Last 3 Encounters:  07/14/24 208 lb 6.4 oz (94.5 kg)  06/30/24 210 lb (95.3 kg)  06/09/24 213 lb 6 oz (96.8 kg)      Lucius Krabbe, NP

## 2024-07-15 ENCOUNTER — Telehealth: Payer: Self-pay

## 2024-07-15 NOTE — Telephone Encounter (Signed)
 Please review and advise. Pt does use MyChart.  Copied from CRM [id number]. Topic: Clinical - Lab/Test Results >> Jul 15, 2024  9:23 AM Nina Patterson wrote: Reason for CRM: patient called wanting to get her recent lab work results because she want to know what her hemoglobin is. Patient also want to know if it is high or low [phone]

## 2024-07-16 ENCOUNTER — Encounter: Payer: Self-pay | Admitting: Family Medicine

## 2024-07-16 ENCOUNTER — Ambulatory Visit: Payer: Self-pay | Admitting: Family

## 2024-07-16 NOTE — Telephone Encounter (Signed)
 sent a lab message in spanish.

## 2024-07-19 ENCOUNTER — Telehealth: Payer: Self-pay

## 2024-07-19 NOTE — Telephone Encounter (Signed)
 I reached out to pt and left a voicemail in regards to lab results.   Copied from CRM [id number]. Topic: Clinical - Lab/Test Results >> Jul 19, 2024  1:22 PM Nina Patterson wrote: Reason for CRM: Pt reviewed her lab results online but wants an explanation of what it means from the dr. She was told they were sent on My Chart from [place] but she says she cannot find it. Message is in spanish so specialist is unable to translate. Pt says she is worried about the results and would like to speak with someone soon.

## 2024-07-20 ENCOUNTER — Telehealth: Payer: Self-pay

## 2024-07-20 NOTE — Telephone Encounter (Signed)
 MyChart message was sent on 10/24 by PCP. Labs placed at front desk for pick up.    Copied from CRM #[id number]. Topic: Clinical - Lab/Test Results >> Jul 20, 2024 10:03 AM Alfonso HERO wrote: Reason for CRM: patient called back for results. Patients phone isn't working when you call back asking if she can pick up the results in regards to her hemoglobin. She also stated the info can be explained in [place]. You can try calling her first.

## 2024-08-06 ENCOUNTER — Telehealth: Payer: Self-pay

## 2024-08-06 NOTE — Telephone Encounter (Signed)
 letter printed, thx

## 2024-08-06 NOTE — Telephone Encounter (Signed)
 Copied from CRM [id number]. Topic: General - Other >> Aug 06, 2024  9:39 AM Harlene ORN wrote: Reason for CRM: Patient is requesting a doctor's note for today, requesting for her to return to work starting monday 11/17,  that she can work for 5 hours for 4 days a week. Regarding her low hemoglobin levels. Please call the patient back to confirm when note is ready for pickup.

## 2024-08-06 NOTE — Telephone Encounter (Signed)
 I reached out to pt with interpreter on the line. I let pt know letter is ready for pick up and will be placed at the front office, pt verbalized understanding.

## 2024-08-18 ENCOUNTER — Other Ambulatory Visit: Payer: Self-pay

## 2024-08-18 ENCOUNTER — Other Ambulatory Visit: Payer: Self-pay | Admitting: Family

## 2024-08-18 DIAGNOSIS — E039 Hypothyroidism, unspecified: Secondary | ICD-10-CM

## 2024-08-18 NOTE — Telephone Encounter (Signed)
 Copied from CRM #[id number]. Topic: Clinical - Medication Refill >> Aug 18, 2024  4:24 PM Rea C wrote: Medication: levothyroxine  (SYNTHROID ) 75 MCG tablet  Has the patient contacted their pharmacy? Patient went to the pharmacy and they did not have a refill for her and told her to contact the clinic.   This is the patient's preferred pharmacy:  [facility] [address], [place] - [address] [place] [address] Phone: [phone] Fax: [phone]  Is this the correct pharmacy for this prescription? Yes If no, delete pharmacy and type the correct one.   Has the prescription been filled recently? Yes  Is the patient out of the medication? Yes  Has the patient been seen for an appointment in the last year OR does the patient have an upcoming appointment? Yes  Can we respond through MyChart? Yes  Agent: Please be advised that Rx refills may take up to 3 business days. We ask that you follow-up with your pharmacy.

## 2024-08-25 ENCOUNTER — Encounter: Payer: Self-pay | Admitting: Family

## 2024-08-25 ENCOUNTER — Ambulatory Visit: Admitting: Family

## 2024-08-25 VITALS — BP 132/68 | HR 72 | Temp 97.9°F | Ht 60.0 in | Wt 203.2 lb

## 2024-08-25 DIAGNOSIS — H01134 Eczematous dermatitis of left upper eyelid: Secondary | ICD-10-CM

## 2024-08-25 DIAGNOSIS — D500 Iron deficiency anemia secondary to blood loss (chronic): Secondary | ICD-10-CM

## 2024-08-25 DIAGNOSIS — I152 Hypertension secondary to endocrine disorders: Secondary | ICD-10-CM

## 2024-08-25 MED ORDER — LOSARTAN POTASSIUM 50 MG PO TABS: 50.0000 mg | ORAL_TABLET | Freq: Every day | ORAL | Status: AC | PRN

## 2024-08-25 MED ORDER — LOSARTAN POTASSIUM-HCTZ 50-12.5 MG PO TABS: 1.0000 | ORAL_TABLET | Freq: Every day | ORAL | 1 refills | Status: AC

## 2024-08-25 NOTE — Progress Notes (Signed)
 Patient ID: Nina Patterson, female    DOB: 1951-06-09, 73 y.o.   MRN: [id number]  Chief Complaint  Patient presents with   Hypertension associated with type 2 diabetes mellitus   Type 2 diabetes mellitus without complication, without long  Discussed the use of AI scribe software for clinical note transcription with the patient, who gave verbal consent to proceed.  History of Present Illness Shi Sheily Lineman is a 73 year old female with hypertension who presents for follow-up on her blood pressure management.  She has stopped her nighttime blood pressure medication and now only takes morning losartan  with hydrochlorothiazide . She recently started an over-the-counter supplement (Transfer Factor) which she feels helps her well-being. She is no longer taking aspirin and continues daily iron. Her prior hemoglobin was 7.6 g/dL, and she is due for repeat iron studies today. She missed her gastroenterology endoscopy consultation and has not yet been rescheduled. She has a dry, scaly, enlarging patch on her eye with itching. Hydrocortisone cream helps itch but not dryness. She has not tried ointments such as Vaseline. She has not received flu or tetanus vaccines because she is worried about getting them while her hemoglobin is low.  Assessment & Plan Iron deficiency anemia due to chronic blood loss Iron deficiency anemia with hemoglobin level of 7.6 g/dL. Continues daily iron supplementation. - Ordered lab tests to recheck iron levels.  - Continue daily iron supplementation. - F/U prn based on results  Hypertension associated with type 2 diabetes mellitus Blood pressure is well-controlled with losartan  and hydrochlorothiazide . She has stopped nighttime medication due to no symptoms of elevated blood pressure. Advised to monitor blood pressure at night if experiencing symptoms such as headache or dizziness. - Continue losartan -hydrochlorothiazide  50-12.5mg  in the morning,  refill sent. - Monitor blood pressure at night if experiencing symptoms such as headache or dizziness. - Take nighttime Losartan  50mg  if blood pressure exceeds 140/90 mmHg. - F/U in 4 mos  Dry skin of eyelid Dry, scaly skin on the corner of right upper eyelid, possibly due to dryness. Currently using hydrocortisone for itching. Recommend using baby shampoo as a cleanser and Vaseline or Eucerin/Cerave cream for dryness. - Use baby shampoo as a cleanser. - Apply OTC Vaseline or Eucerin/Cerave cream for dryness or eczema. - Avoid fragranced products around the eye. - F/U if sx persist  General Health Maintenance Discussion about flu and tetanus vaccinations. She is hesitant to receive flu shot due to low hemoglobin levels. Tetanus vaccination status is unclear.   Subjective:    Outpatient Medications Prior to Visit  Medication Sig Dispense Refill   acetaminophen (TYLENOL) 650 MG CR tablet Take 650 mg by mouth every 8 (eight) hours as needed for pain.     atorvastatin  (LIPITOR) 10 MG tablet Take 1 tablet (10 mg total) by mouth at bedtime. 90 tablet 1   Berberine Chloride (BERBERINE HCI PO) Take by mouth.     capsaicin (ZOSTRIX) 0.025 % cream Apply topically.     cholecalciferol (VITAMIN D3) 25 MCG (1000 UNIT) tablet Take 1,000 Units by mouth daily.     cyclobenzaprine  (FLEXERIL ) 5 MG tablet Take 1-2 tablets (5-10 mg total) by mouth 3 (three) times daily as needed (For leg pain. May cause drowsiness and can take 2 pills at bedtime.). 60 tablet 2   ferrous sulfate 324 MG TBEC Take 324 mg by mouth.     FOLIC ACID  PO Take by mouth.     levothyroxine  (SYNTHROID ) 75 MCG tablet Take  1 tablet by mouth once daily 90 tablet 0   losartan -hydrochlorothiazide  (HYZAAR) 50-12.5 MG tablet Take 1 tablet by mouth daily. 90 tablet 0   magnesium gluconate (MAGONATE) 500 (27 Mg) MG TABS tablet Take 500 mg by mouth in the morning and at bedtime.     metFORMIN  (GLUCOPHAGE -XR) 500 MG 24 hr tablet Take 1 tablet  (500 mg total) by mouth 2 (two) times daily with a meal. 180 tablet 1   Oil of Oregano 1500 MG CAPS Take 1 capsule by mouth daily at 6 (six) AM.     Omega-3 Fatty Acids (FISH OIL) 1000 MG CPDR Take 1 tablet by mouth in the morning, at noon, and at bedtime.     OVER THE COUNTER MEDICATION Tri -factor formula     OVER THE COUNTER MEDICATION Take 1,500 mg by mouth daily. GOTU-KOLA     busPIRone  (BUSPAR ) 5 MG tablet Take 1 tablet (5 mg total) by mouth 2 (two) times daily. For Anxiety. (Patient not taking: Reported on 06/30/2024) 60 tablet 5   losartan  (COZAAR ) 50 MG tablet Take 1 tablet (50 mg total) by mouth daily. Toma en la noche. 90 tablet 1   No facility-administered medications prior to visit.   Past Medical History:  Diagnosis Date   Class 2 severe obesity due to excess calories with serious comorbidity and body mass index (BMI) of 39.0 to 39.9 in adult 08/07/2021   Diabetes mellitus without complication (HCC)    Hypertension    Hypothyroidism    Osteoarthritis    Primary hypertension 08/07/2021   History reviewed. No pertinent surgical history. Allergies  Allergen Reactions   Semaglutide  Itching and Rash      Objective:    Physical Exam Vitals and nursing note reviewed.  Constitutional:      Appearance: Normal appearance. She is obese.  Cardiovascular:     Rate and Rhythm: Normal rate and regular rhythm.  Pulmonary:     Effort: Pulmonary effort is normal.     Breath sounds: Normal breath sounds.  Musculoskeletal:        General: Normal range of motion.  Skin:    General: Skin is warm and dry.     Findings: Rash (mild scaling noted in right upper eyelid corner, no erythema or drg noted) present.  Neurological:     Mental Status: She is alert.  Psychiatric:        Mood and Affect: Mood normal.        Behavior: Behavior normal.    BP 132/68 (BP Location: Left Arm, Patient Position: Sitting, Cuff Size: Normal)   Pulse 72   Temp 97.9 F (36.6 C) (Temporal)   Ht 5'  (1.524 m)   Wt 203 lb 4 oz (92.2 kg)   SpO2 96%   BMI 39.69 kg/m  Wt Readings from Last 3 Encounters:  08/25/24 203 lb 4 oz (92.2 kg)  07/14/24 208 lb 6.4 oz (94.5 kg)  06/30/24 210 lb (95.3 kg)      Lucius Krabbe, NP

## 2024-08-26 ENCOUNTER — Ambulatory Visit: Payer: Self-pay | Admitting: Family

## 2024-08-26 LAB — CBC WITH DIFFERENTIAL/PLATELET
Basophils Absolute: 0.1 K/uL (ref 0.0–0.1)
Basophils Relative: 0.9 % (ref 0.0–3.0)
Eosinophils Absolute: 0.3 K/uL (ref 0.0–0.7)
Eosinophils Relative: 2.5 % (ref 0.0–5.0)
HCT: 37.1 % (ref 36.0–46.0)
Hemoglobin: 12.1 g/dL (ref 12.0–15.0)
Lymphocytes Relative: 27.3 % (ref 12.0–46.0)
Lymphs Abs: 2.9 K/uL (ref 0.7–4.0)
MCHC: 32.7 g/dL (ref 30.0–36.0)
MCV: 84.6 fl (ref 78.0–100.0)
Monocytes Absolute: 0.7 K/uL (ref 0.1–1.0)
Monocytes Relative: 6.7 % (ref 3.0–12.0)
Neutro Abs: 6.6 K/uL (ref 1.4–7.7)
Neutrophils Relative %: 62.6 % (ref 43.0–77.0)
Platelets: 389 K/uL (ref 150.0–400.0)
RBC: 4.38 Mil/uL (ref 3.87–5.11)
RDW: 25.1 % — ABNORMAL HIGH (ref 11.5–15.5)
WBC: 10.5 K/uL (ref 4.0–10.5)

## 2024-08-26 LAB — FERRITIN: Ferritin: 14 ng/mL (ref 10.0–291.0)

## 2024-10-15 ENCOUNTER — Ambulatory Visit: Admitting: Family

## 2024-12-29 ENCOUNTER — Ambulatory Visit: Admitting: Family
